# Patient Record
Sex: Female | Born: 1965 | Race: Black or African American | Hispanic: No | Marital: Single | Smoking: Never smoker
Health system: Southern US, Community
[De-identification: ages and names within clinical notes are randomized; demographics above are authoritative.]

## PROBLEM LIST (undated history)

## (undated) DIAGNOSIS — F32A Depression, unspecified: Secondary | ICD-10-CM

## (undated) DIAGNOSIS — F250 Schizoaffective disorder, bipolar type: Secondary | ICD-10-CM

## (undated) DIAGNOSIS — F419 Anxiety disorder, unspecified: Secondary | ICD-10-CM

## (undated) DIAGNOSIS — K5900 Constipation, unspecified: Secondary | ICD-10-CM

## (undated) DIAGNOSIS — I4510 Unspecified right bundle-branch block: Secondary | ICD-10-CM

## (undated) DIAGNOSIS — I10 Essential (primary) hypertension: Secondary | ICD-10-CM

## (undated) DIAGNOSIS — F329 Major depressive disorder, single episode, unspecified: Secondary | ICD-10-CM

## (undated) DIAGNOSIS — F79 Unspecified intellectual disabilities: Secondary | ICD-10-CM

## (undated) DIAGNOSIS — M1990 Unspecified osteoarthritis, unspecified site: Secondary | ICD-10-CM

## (undated) DIAGNOSIS — R42 Dizziness and giddiness: Secondary | ICD-10-CM

## (undated) DIAGNOSIS — E119 Type 2 diabetes mellitus without complications: Secondary | ICD-10-CM

## (undated) DIAGNOSIS — F259 Schizoaffective disorder, unspecified: Secondary | ICD-10-CM

## (undated) DIAGNOSIS — E785 Hyperlipidemia, unspecified: Secondary | ICD-10-CM

## (undated) HISTORY — PX: ABDOMINAL HYSTERECTOMY: SHX81

## (undated) HISTORY — PX: BACK SURGERY: SHX140

---

## 2003-11-10 ENCOUNTER — Ambulatory Visit: Payer: Self-pay | Admitting: Internal Medicine

## 2004-06-28 ENCOUNTER — Emergency Department: Payer: Self-pay | Admitting: Unknown Physician Specialty

## 2006-06-03 ENCOUNTER — Emergency Department: Payer: Self-pay | Admitting: Emergency Medicine

## 2006-06-04 ENCOUNTER — Inpatient Hospital Stay: Payer: Self-pay | Admitting: Internal Medicine

## 2006-06-13 ENCOUNTER — Inpatient Hospital Stay: Payer: Self-pay | Admitting: Internal Medicine

## 2006-07-12 ENCOUNTER — Emergency Department: Payer: Self-pay | Admitting: Emergency Medicine

## 2010-05-02 ENCOUNTER — Ambulatory Visit: Payer: Self-pay | Admitting: Internal Medicine

## 2010-06-21 ENCOUNTER — Ambulatory Visit: Payer: Self-pay | Admitting: Emergency Medicine

## 2010-06-22 ENCOUNTER — Ambulatory Visit: Payer: Self-pay | Admitting: Emergency Medicine

## 2010-06-26 LAB — PATHOLOGY REPORT

## 2010-06-29 ENCOUNTER — Emergency Department: Payer: Self-pay | Admitting: Emergency Medicine

## 2011-05-29 ENCOUNTER — Ambulatory Visit: Payer: Self-pay | Admitting: Internal Medicine

## 2012-06-23 ENCOUNTER — Ambulatory Visit: Payer: Self-pay | Admitting: Internal Medicine

## 2013-04-04 ENCOUNTER — Emergency Department: Payer: Self-pay | Admitting: Emergency Medicine

## 2013-04-04 LAB — CBC WITH DIFFERENTIAL/PLATELET
Basophil #: 0 10*3/uL (ref 0.0–0.1)
Basophil %: 0.5 %
EOS PCT: 3.2 %
Eosinophil #: 0.2 10*3/uL (ref 0.0–0.7)
HCT: 34.9 % — ABNORMAL LOW (ref 35.0–47.0)
HGB: 12 g/dL (ref 12.0–16.0)
LYMPHS ABS: 1.7 10*3/uL (ref 1.0–3.6)
Lymphocyte %: 33.4 %
MCH: 31.2 pg (ref 26.0–34.0)
MCHC: 34.3 g/dL (ref 32.0–36.0)
MCV: 91 fL (ref 80–100)
MONOS PCT: 10.7 %
Monocyte #: 0.6 x10 3/mm (ref 0.2–0.9)
NEUTROS ABS: 2.7 10*3/uL (ref 1.4–6.5)
Neutrophil %: 52.2 %
Platelet: 134 10*3/uL — ABNORMAL LOW (ref 150–440)
RBC: 3.84 10*6/uL (ref 3.80–5.20)
RDW: 14.4 % (ref 11.5–14.5)
WBC: 5.1 10*3/uL (ref 3.6–11.0)

## 2013-04-04 LAB — COMPREHENSIVE METABOLIC PANEL
ALBUMIN: 3.6 g/dL (ref 3.4–5.0)
ANION GAP: 3 — AB (ref 7–16)
Alkaline Phosphatase: 146 U/L — ABNORMAL HIGH
BUN: 15 mg/dL (ref 7–18)
Bilirubin,Total: 0.2 mg/dL (ref 0.2–1.0)
Calcium, Total: 8.8 mg/dL (ref 8.5–10.1)
Chloride: 105 mmol/L (ref 98–107)
Co2: 31 mmol/L (ref 21–32)
Creatinine: 1.56 mg/dL — ABNORMAL HIGH (ref 0.60–1.30)
EGFR (African American): 45 — ABNORMAL LOW
GFR CALC NON AF AMER: 39 — AB
Glucose: 166 mg/dL — ABNORMAL HIGH (ref 65–99)
Osmolality: 282 (ref 275–301)
POTASSIUM: 4.2 mmol/L (ref 3.5–5.1)
SGOT(AST): 28 U/L (ref 15–37)
SGPT (ALT): 45 U/L (ref 12–78)
SODIUM: 139 mmol/L (ref 136–145)
TOTAL PROTEIN: 7.5 g/dL (ref 6.4–8.2)

## 2013-04-04 LAB — URINALYSIS, COMPLETE
Bilirubin,UR: NEGATIVE
Blood: NEGATIVE
Glucose,UR: NEGATIVE mg/dL (ref 0–75)
KETONE: NEGATIVE
Nitrite: NEGATIVE
PROTEIN: NEGATIVE
Ph: 6 (ref 4.5–8.0)
RBC,UR: 2 /HPF (ref 0–5)
Specific Gravity: 1.01 (ref 1.003–1.030)
WBC UR: 15 /HPF (ref 0–5)

## 2013-04-30 ENCOUNTER — Ambulatory Visit: Payer: Self-pay | Admitting: Gastroenterology

## 2013-05-11 ENCOUNTER — Ambulatory Visit: Payer: Self-pay | Admitting: Gastroenterology

## 2013-05-18 ENCOUNTER — Ambulatory Visit: Payer: Self-pay | Admitting: Gastroenterology

## 2013-05-19 ENCOUNTER — Emergency Department: Payer: Self-pay | Admitting: Emergency Medicine

## 2013-05-19 LAB — URINALYSIS, COMPLETE
BLOOD: NEGATIVE
Bilirubin,UR: NEGATIVE
Glucose,UR: NEGATIVE mg/dL (ref 0–75)
Ketone: NEGATIVE
Leukocyte Esterase: NEGATIVE
Nitrite: NEGATIVE
Ph: 5 (ref 4.5–8.0)
Protein: NEGATIVE
Specific Gravity: 1.005 (ref 1.003–1.030)

## 2013-05-19 LAB — CBC
HCT: 36.3 % (ref 35.0–47.0)
HGB: 12.3 g/dL (ref 12.0–16.0)
MCH: 30.5 pg (ref 26.0–34.0)
MCHC: 33.8 g/dL (ref 32.0–36.0)
MCV: 90 fL (ref 80–100)
PLATELETS: 129 10*3/uL — AB (ref 150–440)
RBC: 4.02 10*6/uL (ref 3.80–5.20)
RDW: 13.6 % (ref 11.5–14.5)
WBC: 6.1 10*3/uL (ref 3.6–11.0)

## 2013-05-19 LAB — COMPREHENSIVE METABOLIC PANEL
ALBUMIN: 3.5 g/dL (ref 3.4–5.0)
ALK PHOS: 125 U/L — AB
AST: 32 U/L (ref 15–37)
Anion Gap: 4 — ABNORMAL LOW (ref 7–16)
BUN: 9 mg/dL (ref 7–18)
Bilirubin,Total: 0.4 mg/dL (ref 0.2–1.0)
CO2: 28 mmol/L (ref 21–32)
Calcium, Total: 9.2 mg/dL (ref 8.5–10.1)
Chloride: 108 mmol/L — ABNORMAL HIGH (ref 98–107)
Creatinine: 0.99 mg/dL (ref 0.60–1.30)
EGFR (Non-African Amer.): >60
GLUCOSE: 121 mg/dL — AB (ref 65–99)
OSMOLALITY: 279 (ref 275–301)
Potassium: 4.1 mmol/L (ref 3.5–5.1)
SGPT (ALT): 28 U/L (ref 12–78)
SODIUM: 140 mmol/L (ref 136–145)
Total Protein: 7.2 g/dL (ref 6.4–8.2)

## 2013-05-19 LAB — LIPASE, BLOOD: Lipase: 78 U/L (ref 73–393)

## 2013-07-14 ENCOUNTER — Ambulatory Visit: Payer: Self-pay | Admitting: Internal Medicine

## 2013-07-21 ENCOUNTER — Ambulatory Visit: Payer: Self-pay | Admitting: Internal Medicine

## 2014-04-11 ENCOUNTER — Emergency Department: Payer: Self-pay | Admitting: Emergency Medicine

## 2014-04-27 ENCOUNTER — Emergency Department
Admit: 2014-04-27 | Disposition: A | Discharge status: Home / Self Care | Payer: Self-pay | Admitting: Emergency Medicine

## 2014-04-27 LAB — CBC WITH DIFFERENTIAL/PLATELET
BASOS ABS: 0 10*3/uL (ref 0.0–0.1)
BASOS PCT: 0.6 %
Eosinophil #: 0.2 10*3/uL (ref 0.0–0.7)
Eosinophil %: 3.5 %
HCT: 35 % (ref 35.0–47.0)
HGB: 11.8 g/dL — ABNORMAL LOW (ref 12.0–16.0)
LYMPHS ABS: 1.2 10*3/uL (ref 1.0–3.6)
Lymphocyte %: 23 %
MCH: 30.4 pg (ref 26.0–34.0)
MCHC: 33.6 g/dL (ref 32.0–36.0)
MCV: 91 fL (ref 80–100)
MONO ABS: 0.4 x10 3/mm (ref 0.2–0.9)
Monocyte %: 8 %
NEUTROS ABS: 3.3 10*3/uL (ref 1.4–6.5)
NEUTROS PCT: 64.9 %
PLATELETS: 113 10*3/uL — AB (ref 150–440)
RBC: 3.87 10*6/uL (ref 3.80–5.20)
RDW: 13.3 % (ref 11.5–14.5)
WBC: 5.1 10*3/uL (ref 3.6–11.0)

## 2014-04-27 LAB — COMPREHENSIVE METABOLIC PANEL
ANION GAP: 6 — AB (ref 7–16)
AST: 20 U/L
Albumin: 3.9 g/dL
Alkaline Phosphatase: 104 U/L
BILIRUBIN TOTAL: 0.2 mg/dL — AB
BUN: 12 mg/dL
CALCIUM: 9.8 mg/dL
CHLORIDE: 104 mmol/L
Co2: 29 mmol/L
Creatinine: 1.03 mg/dL — ABNORMAL HIGH
EGFR (African American): >60
Glucose: 192 mg/dL — ABNORMAL HIGH
POTASSIUM: 3.8 mmol/L
SGPT (ALT): 22 U/L
SODIUM: 139 mmol/L
Total Protein: 7 g/dL

## 2014-04-27 LAB — URINALYSIS, COMPLETE
Bilirubin,UR: NEGATIVE
Blood: NEGATIVE
Glucose,UR: >=500 mg/dL (ref 0–75)
Ketone: NEGATIVE
NITRITE: NEGATIVE
PH: 5 (ref 4.5–8.0)
PROTEIN: NEGATIVE
Specific Gravity: 1.008 (ref 1.003–1.030)
Squamous Epithelial: 2

## 2014-05-16 ENCOUNTER — Emergency Department
Admit: 2014-05-16 | Disposition: A | Discharge status: Home / Self Care | Payer: Self-pay | Admitting: Emergency Medicine

## 2014-05-16 LAB — URINALYSIS, COMPLETE
BLOOD: NEGATIVE
Bilirubin,UR: NEGATIVE
Ketone: NEGATIVE
NITRITE: NEGATIVE
PH: 6 (ref 4.5–8.0)
Protein: NEGATIVE
Specific Gravity: 1.006 (ref 1.003–1.030)

## 2014-05-18 LAB — URINE CULTURE

## 2014-06-05 ENCOUNTER — Emergency Department
Admission: EM | Admit: 2014-06-05 | Discharge: 2014-06-05 | Disposition: A | Discharge status: Home / Self Care | Payer: Medicare Other | Attending: Emergency Medicine | Admitting: Emergency Medicine

## 2014-06-05 ENCOUNTER — Encounter: Payer: Self-pay | Admitting: Emergency Medicine

## 2014-06-05 ENCOUNTER — Emergency Department: Payer: Medicare Other

## 2014-06-05 DIAGNOSIS — I10 Essential (primary) hypertension: Secondary | ICD-10-CM | POA: Diagnosis not present

## 2014-06-05 DIAGNOSIS — G8929 Other chronic pain: Secondary | ICD-10-CM | POA: Diagnosis not present

## 2014-06-05 DIAGNOSIS — M545 Low back pain: Secondary | ICD-10-CM | POA: Insufficient documentation

## 2014-06-05 DIAGNOSIS — E119 Type 2 diabetes mellitus without complications: Secondary | ICD-10-CM | POA: Insufficient documentation

## 2014-06-05 DIAGNOSIS — M5410 Radiculopathy, site unspecified: Secondary | ICD-10-CM | POA: Insufficient documentation

## 2014-06-05 HISTORY — DX: Constipation, unspecified: K59.00

## 2014-06-05 HISTORY — DX: Type 2 diabetes mellitus without complications: E11.9

## 2014-06-05 HISTORY — DX: Hyperlipidemia, unspecified: E78.5

## 2014-06-05 HISTORY — DX: Unspecified intellectual disabilities: F79

## 2014-06-05 HISTORY — DX: Schizoaffective disorder, bipolar type: F25.0

## 2014-06-05 HISTORY — DX: Essential (primary) hypertension: I10

## 2014-06-05 HISTORY — DX: Schizoaffective disorder, unspecified: F25.9

## 2014-06-05 LAB — URINALYSIS COMPLETE WITH MICROSCOPIC (ARMC ONLY)
Bilirubin Urine: NEGATIVE
Glucose, UA: 50 mg/dL — AB
Hgb urine dipstick: NEGATIVE
Ketones, ur: NEGATIVE mg/dL
Nitrite: NEGATIVE
Protein, ur: NEGATIVE mg/dL
Specific Gravity, Urine: 1.008 (ref 1.005–1.030)
pH: 5 (ref 5.0–8.0)

## 2014-06-05 MED ORDER — ETODOLAC 400 MG PO TABS: 400.0000 mg | ORAL_TABLET | Freq: Two times a day (BID) | ORAL | Status: DC

## 2014-06-05 NOTE — ED Notes (Signed)
BIB from [facility] with group home owner Reece LeaderBarbara [place]. Pt reports lower back pain that runs down right leg for past 2 weeks. Denies injury

## 2014-06-05 NOTE — ED Provider Notes (Signed)
[facility] Emergency Department Provider Note  ____________________________________________  Time seen: 11:25 AM  I have reviewed the triage vital signs and the nursing notes.   HISTORY  Chief Complaint Back Pain   HPI Kristina Foster is a 49 y.o. female putting today by her caregiver at a group home with complaint of constant low back pain that runs into her right leg for the past 2 weeks. She has been seen by Dr. Maryellen PileEason her primary care doctor who placed her on tramadol. 2 weeks ago she was placed on Mobic which did not help her pain at all The group home owner states that this is not helping with the pain at all.Most of the history is given by Kristina LeaderBarbara Foster, but the patient occasionally is verbal. Low back pain mostly on the right with some radiation into the right leg. Patient states it's a 10 out of 10 on the pain scale. Movement makes her pain worse.  Her given her caregiver states she wants tests done today for back pain.   Past Medical History  Diagnosis Date  . Diabetes mellitus without complication   . Mental disability   . Hypertension   . Hyperlipemia   . Schizo affective schizophrenia   . Constipation     There are no active problems to display for this patient.   Past Surgical History  Procedure Laterality Date  . Abdominal hysterectomy      Current Outpatient Rx  Name  Route  Sig  Dispense  Refill  . etodolac (LODINE) 400 MG tablet   Oral   Take 1 tablet (400 mg total) by mouth 2 (two) times daily.   20 tablet   0     Allergies Review of patient's allergies indicates no known allergies.  History reviewed. No pertinent family history.  Social History History  Substance Use Topics  . Smoking status: Never Smoker   . Smokeless tobacco: Not on file  . Alcohol Use: No    Review of Systems Constitutional: No fever/chills Eyes: No visual changes. ENT: No sore throat. Cardiovascular: Denies chest pain. Respiratory:  Denies shortness of breath. Gastrointestinal: No abdominal pain.  No nausea, no vomiting.  No diarrhea.  Positive chronic constipation. Genitourinary: Negative for dysuria. Musculoskeletal: Positive for back pain. Skin: Negative for rash. Neurological: Negative for headaches, focal weakness or numbness.  10-point ROS otherwise negative.  ____________________________________________   PHYSICAL EXAM:  VITAL SIGNS: ED Triage Vitals  Enc Vitals Group     BP 06/05/14 1048 100/59 mmHg     Pulse Rate 06/05/14 1048 72     Resp 06/05/14 1048 18     Temp 06/05/14 1048 98.2 F (36.8 C)     Temp Source 06/05/14 1048 Oral     SpO2 06/05/14 1048 100 %     Weight 06/05/14 1048 178 lb (80.74 kg)     Height 06/05/14 1048 5\' 2"  (1.575 m)     Head Cir --      Peak Flow --      Pain Score 06/05/14 1059 10     Pain Loc --      Pain Edu? --      Excl. in GC? --     Constitutional: Alert and oriented. Well appearing and in no acute distress. Eyes: Conjunctivae are normal. PERRL. EOMI. Head: Atraumatic. Nose: No congestion/rhinnorhea. Neck: No stridor.  Nontender cervical spine to palpation Hematological/Lymphatic/Immunilogical: No cervical lymphadenopathy. Cardiovascular: Normal rate, regular rhythm. Grossly normal heart sounds.  Good  peripheral circulation. Respiratory: Normal respiratory effort.  No retractions. Lungs CTAB. Gastrointestinal: Soft and nontender. No distention. No abdominal bruits. No CVA tenderness. Musculoskeletal: No lower extremity tenderness nor edema.  No joint effusions. Neurologic:  Normal speech and language. No gross focal neurologic deficits are appreciated. Speech is normal. No gait instability. Skin:  Skin is warm, dry and intact. No rash noted. Psychiatric: Mood and affect are flat. Speech simple 1 words and behavior are normal.  ____________________________________________   LABS (all labs ordered are listed, but only abnormal results are displayed)  Labs  Reviewed  URINALYSIS COMPLETEWITH MICROSCOPIC ([facility])  - Abnormal; Notable for the following:    Color, Urine YELLOW (*)    APPearance CLEAR (*)    Glucose, UA 50 (*)    Leukocytes, UA 2+ (*)    Bacteria, UA RARE (*)    Squamous Epithelial / LPF 0-5 (*)    All other components within normal limits   ____________________________________________  EKG Not done ____________________________________________  RADIOLOGY  Mild multilevel degenerative disc disease and facet arthropathy ____________________________________________   PROCEDURES  Procedure(s) performed: None  Critical Care performed: No  ____________________________________________   INITIAL IMPRESSION / ASSESSMENT AND PLAN / ED COURSE  Pertinent labs & imaging results that were available during my care of the patient were reviewed by me and considered in my medical decision making (see chart for details).  Caregiver states that the patient has an appointment [facility] next week in the orthopedic department. She will start a prescription of Lodine and continue tramadol as needed for severe pain. ____________________________________________   FINAL CLINICAL IMPRESSION(S) / ED DIAGNOSES  Final diagnoses:  Chronic lower back pain  Radicular pain of right lower extremity      Tommi RumpsRhonda L Aislee Landgren, PA-C 06/05/14 1402  Phineas SemenGraydon Goodman, MD 06/05/14 1510

## 2014-06-05 NOTE — ED Notes (Signed)
NAD noted at time of D/C. Pt denies questions or concerns. Pt taken to the lobby at this time via wheelchair.   

## 2014-06-28 ENCOUNTER — Other Ambulatory Visit: Payer: Self-pay | Admitting: Specialist

## 2014-06-28 DIAGNOSIS — M5136 Other intervertebral disc degeneration, lumbar region: Secondary | ICD-10-CM

## 2014-07-04 ENCOUNTER — Ambulatory Visit
Admission: RE | Admit: 2014-07-04 | Discharge: 2014-07-04 | Disposition: A | Discharge status: Home / Self Care | Payer: Medicare Other | Source: Ambulatory Visit | Attending: Specialist | Admitting: Specialist

## 2014-07-04 DIAGNOSIS — M4806 Spinal stenosis, lumbar region: Secondary | ICD-10-CM | POA: Diagnosis not present

## 2014-07-04 DIAGNOSIS — M5136 Other intervertebral disc degeneration, lumbar region: Secondary | ICD-10-CM | POA: Insufficient documentation

## 2014-07-04 DIAGNOSIS — Q7649 Other congenital malformations of spine, not associated with scoliosis: Secondary | ICD-10-CM | POA: Diagnosis not present

## 2014-07-04 DIAGNOSIS — N289 Disorder of kidney and ureter, unspecified: Secondary | ICD-10-CM | POA: Insufficient documentation

## 2014-07-12 ENCOUNTER — Ambulatory Visit: Admission: RE | Admit: 2014-07-12 | Payer: Medicare Other | Source: Ambulatory Visit | Admitting: Gastroenterology

## 2014-07-12 ENCOUNTER — Encounter: Admission: RE | Payer: Self-pay | Source: Ambulatory Visit

## 2014-07-12 SURGERY — COLONOSCOPY WITH PROPOFOL
Anesthesia: General

## 2014-07-12 SURGERY — COLONOSCOPY
Anesthesia: General

## 2014-07-18 ENCOUNTER — Other Ambulatory Visit: Payer: Self-pay | Admitting: Neurosurgery

## 2014-07-28 NOTE — Pre-Procedure Instructions (Signed)
   Willaim Baneatricia A [place]omac  07/28/2014     Your procedure is scheduled on: Tuesday August 02, 2014 at 11:30 AM.  [facility] Ad[facility] at 9:30 A.M.  Call this number if you have problems the morning of surgery: [phone]   Remember:  Do not eat food or drink liquids after midnight.  Take these medicines the morning of surgery with A SIP OF WATER: Acetaminophen (Tylenol) if needed, Benztropine (Cogentin), Metoprolol (Toprol XL), Tramadol (Ultram) if needed, Venlafaxine (Effexor)   Only take 16 Units of Lantus the night before your surgery   Do NOT take any diabetic medications the morning of your surgery (Ex. Glipizide/Glucotrol)   Please stop taking any aspirin, vitamins, herbal medications, Ibuprofen, Advil, Motrin, Aleve, etc   Do not wear jewelry, make-up or nail polish.  Do not wear lotions, powders, or perfumes.   Do not shave 48 hours prior to surgery.    Do not bring valuables to the hospital.  [facility] is not responsible for any belongings or valuables.  Contacts, dentures or bridgework may not be worn into surgery.  Leave your suitcase in the car.  After surgery it may be brought to your room.  For patients admitted to the hospital, discharge time will be determined by your treatment team.  Patients discharged the day of surgery will not be allowed to drive home.   Name and phone number of your driver:    Special instructions:  Shower using CHG soap the night before and the morning of your surgery  Please read over the following fact sheets that you were given. Pain Booklet, Coughing and Deep Breathing, Blood Transfusion Information, MRSA Information and Surgical Site Infection Prevention

## 2014-07-29 ENCOUNTER — Encounter (HOSPITAL_COMMUNITY)
Admission: RE | Admit: 2014-07-29 | Discharge: 2014-07-29 | Disposition: A | Discharge status: Home / Self Care | Payer: Medicare Other | Source: Ambulatory Visit | Attending: Neurosurgery | Admitting: Neurosurgery

## 2014-07-29 ENCOUNTER — Encounter (HOSPITAL_COMMUNITY): Payer: Self-pay

## 2014-07-29 DIAGNOSIS — Z794 Long term (current) use of insulin: Secondary | ICD-10-CM | POA: Diagnosis not present

## 2014-07-29 DIAGNOSIS — F259 Schizoaffective disorder, unspecified: Secondary | ICD-10-CM | POA: Diagnosis not present

## 2014-07-29 DIAGNOSIS — I4510 Unspecified right bundle-branch block: Secondary | ICD-10-CM | POA: Diagnosis not present

## 2014-07-29 DIAGNOSIS — Z01818 Encounter for other preprocedural examination: Secondary | ICD-10-CM | POA: Diagnosis present

## 2014-07-29 DIAGNOSIS — Z7982 Long term (current) use of aspirin: Secondary | ICD-10-CM | POA: Diagnosis not present

## 2014-07-29 DIAGNOSIS — Z0183 Encounter for blood typing: Secondary | ICD-10-CM | POA: Diagnosis not present

## 2014-07-29 DIAGNOSIS — Z01812 Encounter for preprocedural laboratory examination: Secondary | ICD-10-CM | POA: Insufficient documentation

## 2014-07-29 DIAGNOSIS — Z79899 Other long term (current) drug therapy: Secondary | ICD-10-CM | POA: Diagnosis not present

## 2014-07-29 DIAGNOSIS — E119 Type 2 diabetes mellitus without complications: Secondary | ICD-10-CM | POA: Insufficient documentation

## 2014-07-29 DIAGNOSIS — M4806 Spinal stenosis, lumbar region: Secondary | ICD-10-CM | POA: Insufficient documentation

## 2014-07-29 DIAGNOSIS — M479 Spondylosis, unspecified: Secondary | ICD-10-CM | POA: Diagnosis not present

## 2014-07-29 HISTORY — DX: Depression, unspecified: F32.A

## 2014-07-29 HISTORY — DX: Dizziness and giddiness: R42

## 2014-07-29 HISTORY — DX: Major depressive disorder, single episode, unspecified: F32.9

## 2014-07-29 HISTORY — DX: Unspecified osteoarthritis, unspecified site: M19.90

## 2014-07-29 HISTORY — DX: Anxiety disorder, unspecified: F41.9

## 2014-07-29 LAB — BASIC METABOLIC PANEL
Anion gap: 7 (ref 5–15)
BUN: 14 mg/dL (ref 6–20)
CHLORIDE: 101 mmol/L (ref 101–111)
CO2: 32 mmol/L (ref 22–32)
Calcium: 10.2 mg/dL (ref 8.9–10.3)
Creatinine, Ser: 1.08 mg/dL — ABNORMAL HIGH (ref 0.44–1.00)
GFR calc Af Amer: >60 mL/min (ref >60)
GFR, EST NON AFRICAN AMERICAN: 59 mL/min — AB (ref >60)
GLUCOSE: 176 mg/dL — AB (ref 65–99)
POTASSIUM: 4 mmol/L (ref 3.5–5.1)
SODIUM: 140 mmol/L (ref 135–145)

## 2014-07-29 LAB — SURGICAL PCR SCREEN
MRSA, PCR: NEGATIVE
Staphylococcus aureus: NEGATIVE

## 2014-07-29 LAB — CBC
HEMATOCRIT: 35.1 % — AB (ref 36.0–46.0)
Hemoglobin: 12.2 g/dL (ref 12.0–15.0)
MCH: 30.7 pg (ref 26.0–34.0)
MCHC: 34.8 g/dL (ref 30.0–36.0)
MCV: 88.2 fL (ref 78.0–100.0)
Platelets: 121 10*3/uL — ABNORMAL LOW (ref 150–400)
RBC: 3.98 MIL/uL (ref 3.87–5.11)
RDW: 12.9 % (ref 11.5–15.5)
WBC: 5.7 10*3/uL (ref 4.0–10.5)

## 2014-07-29 LAB — GLUCOSE, CAPILLARY: Glucose-Capillary: 203 mg/dL — ABNORMAL HIGH (ref 65–99)

## 2014-07-29 NOTE — Progress Notes (Signed)
Patient arrived to PAT via wheelchair with caregiver Britta MccreedyBarbara. [facility] been living with her for 35 years. Patient is alert and oriented X 4. PCP is Toy CookeyErnest Eason. Patient denied having any acute cardiac or pulmonary issues.   Nurse asked patients caregiver about glucose and she informed Nurse that the highest patients blood sugar has been was 352 and the lowest was 54. CBG on arrival to PAT was 203.

## 2014-07-29 NOTE — Progress Notes (Signed)
Blood bank called and told secretary Meriam SpragueBeverly that another T&S would need to be re-drawn DOS due to antibodies being found in blood. Will order STAT T&S DOS.

## 2014-08-01 MED ORDER — CEFAZOLIN SODIUM-DEXTROSE 2-3 GM-% IV SOLR
2.0000 g | INTRAVENOUS | Status: AC
  Administered 2014-08-02: 2 g via INTRAVENOUS

## 2014-08-01 NOTE — Progress Notes (Signed)
Anesthesia Chart Review: Patient is a 49 year old female scheduled for L4-5 decompression with PLIF on 08/02/14 by Dr. Conchita ParisNundkumar.  History includes non-smoker, Schizoaffective schizophrenia, HLD, anxiety, DM2, arthritis, hysterectomy, dizziness, chronic constipation. PCP was reported as Dr. Toy CookeyErnest Eason.  Meds include ASA (on hold), Lipitor, Cogentin, Welchol, glipizide, Lantus, Norco, lisinopril-HCTZ, Amitiza, Toprol XL, Risperidal, Effexor.  07/29/14 EKG: NSR, RBBB. RBBB has been present since at least 06/21/10.  Preoperative labs noted. She needs T&S redrawn due to antibodies. According to PAT RN notes, per patient's caregiver, highest recent glucose 352 and lowest 54. A1C was not done at PAT, so is scheduled to be drawn on the morning of surgery. Non-fasting glucose at PAT was 176. She will get a fasting CBG on arrival the day of surgery.  If CBG results acceptable on the day of surgery and otherwise no acute changes then I would anticipate that she could proceed as planned.  Velna Ochsllison Zelenak, PA-C [facility]/Anesthesiology Phone [phone] 08/01/2014 10:03 AM

## 2014-08-02 ENCOUNTER — Encounter (HOSPITAL_COMMUNITY): Payer: Self-pay | Admitting: Anesthesiology

## 2014-08-02 ENCOUNTER — Inpatient Hospital Stay (HOSPITAL_COMMUNITY): Payer: Medicare Other

## 2014-08-02 ENCOUNTER — Inpatient Hospital Stay (HOSPITAL_COMMUNITY): Payer: Medicare Other | Admitting: Anesthesiology

## 2014-08-02 ENCOUNTER — Inpatient Hospital Stay (HOSPITAL_COMMUNITY): Payer: Medicare Other | Admitting: Vascular Surgery

## 2014-08-02 ENCOUNTER — Encounter (HOSPITAL_COMMUNITY)
Admission: RE | Disposition: A | Discharge status: Skilled Nursing Facility | Payer: Self-pay | Source: Ambulatory Visit | Attending: Neurosurgery

## 2014-08-02 ENCOUNTER — Inpatient Hospital Stay (HOSPITAL_COMMUNITY)
Admission: RE | Admit: 2014-08-02 | Discharge: 2014-08-05 | DRG: 460 | Disposition: A | Discharge status: Skilled Nursing Facility | Payer: Medicare Other | Source: Ambulatory Visit | Attending: Neurosurgery | Admitting: Neurosurgery

## 2014-08-02 DIAGNOSIS — M4806 Spinal stenosis, lumbar region: Secondary | ICD-10-CM | POA: Diagnosis not present

## 2014-08-02 DIAGNOSIS — Z23 Encounter for immunization: Secondary | ICD-10-CM | POA: Diagnosis not present

## 2014-08-02 DIAGNOSIS — E785 Hyperlipidemia, unspecified: Secondary | ICD-10-CM | POA: Diagnosis not present

## 2014-08-02 DIAGNOSIS — R109 Unspecified abdominal pain: Secondary | ICD-10-CM | POA: Diagnosis not present

## 2014-08-02 DIAGNOSIS — I959 Hypotension, unspecified: Secondary | ICD-10-CM | POA: Diagnosis not present

## 2014-08-02 DIAGNOSIS — E119 Type 2 diabetes mellitus without complications: Secondary | ICD-10-CM | POA: Diagnosis not present

## 2014-08-02 DIAGNOSIS — M47896 Other spondylosis, lumbar region: Secondary | ICD-10-CM | POA: Diagnosis present

## 2014-08-02 DIAGNOSIS — M79604 Pain in right leg: Secondary | ICD-10-CM | POA: Diagnosis present

## 2014-08-02 DIAGNOSIS — F259 Schizoaffective disorder, unspecified: Secondary | ICD-10-CM | POA: Diagnosis not present

## 2014-08-02 DIAGNOSIS — K5900 Constipation, unspecified: Secondary | ICD-10-CM | POA: Diagnosis not present

## 2014-08-02 DIAGNOSIS — M1990 Unspecified osteoarthritis, unspecified site: Secondary | ICD-10-CM | POA: Diagnosis not present

## 2014-08-02 DIAGNOSIS — I10 Essential (primary) hypertension: Secondary | ICD-10-CM | POA: Diagnosis not present

## 2014-08-02 DIAGNOSIS — M5416 Radiculopathy, lumbar region: Secondary | ICD-10-CM | POA: Diagnosis present

## 2014-08-02 DIAGNOSIS — M47816 Spondylosis without myelopathy or radiculopathy, lumbar region: Secondary | ICD-10-CM | POA: Diagnosis present

## 2014-08-02 DIAGNOSIS — M549 Dorsalgia, unspecified: Secondary | ICD-10-CM | POA: Diagnosis present

## 2014-08-02 DIAGNOSIS — Z419 Encounter for procedure for purposes other than remedying health state, unspecified: Secondary | ICD-10-CM

## 2014-08-02 LAB — GLUCOSE, CAPILLARY
GLUCOSE-CAPILLARY: 260 mg/dL — AB (ref 65–99)
Glucose-Capillary: 103 mg/dL — ABNORMAL HIGH (ref 65–99)
Glucose-Capillary: 104 mg/dL — ABNORMAL HIGH (ref 65–99)

## 2014-08-02 LAB — CBC
HEMATOCRIT: 30.4 % — AB (ref 36.0–46.0)
Hemoglobin: 11 g/dL — ABNORMAL LOW (ref 12.0–15.0)
MCH: 31.8 pg (ref 26.0–34.0)
MCHC: 36.2 g/dL — ABNORMAL HIGH (ref 30.0–36.0)
MCV: 87.9 fL (ref 78.0–100.0)
Platelets: 112 10*3/uL — ABNORMAL LOW (ref 150–400)
RBC: 3.46 MIL/uL — ABNORMAL LOW (ref 3.87–5.11)
RDW: 13 % (ref 11.5–15.5)
WBC: 8.4 10*3/uL (ref 4.0–10.5)

## 2014-08-02 LAB — CREATININE, SERUM
CREATININE: 1.12 mg/dL — AB (ref 0.44–1.00)
GFR calc Af Amer: >60 mL/min (ref >60)
GFR calc non Af Amer: 57 mL/min — ABNORMAL LOW (ref >60)

## 2014-08-02 LAB — TYPE AND SCREEN
ABO/RH(D): AB POS
Antibody Screen: POSITIVE

## 2014-08-02 SURGERY — POSTERIOR LUMBAR FUSION 1 LEVEL
Anesthesia: General

## 2014-08-02 MED ORDER — BUPIVACAINE LIPOSOME 1.3 % IJ SUSP
20.0000 mL | INTRAMUSCULAR | Status: DC
  Filled 2014-08-02: qty 20

## 2014-08-02 MED ORDER — CEFAZOLIN SODIUM 1-5 GM-% IV SOLN
1.0000 g | Freq: Three times a day (TID) | INTRAVENOUS | Status: AC
  Administered 2014-08-02 – 2014-08-03 (×2): 1 g via INTRAVENOUS
  Filled 2014-08-02 (×4): qty 50

## 2014-08-02 MED ORDER — PHENYLEPHRINE HCL 10 MG/ML IJ SOLN
10.0000 mg | INTRAVENOUS | Status: DC | PRN
  Administered 2014-08-02: 20 ug/min via INTRAVENOUS

## 2014-08-02 MED ORDER — BENZTROPINE MESYLATE 0.5 MG PO TABS
2.0000 mg | ORAL_TABLET | Freq: Two times a day (BID) | ORAL | Status: DC
  Administered 2014-08-02 – 2014-08-05 (×6): 2 mg via ORAL
  Filled 2014-08-02 (×6): qty 4

## 2014-08-02 MED ORDER — THROMBIN 20000 UNITS EX SOLR
CUTANEOUS | Status: DC | PRN
  Administered 2014-08-02: 12:00:00 via TOPICAL

## 2014-08-02 MED ORDER — GLIPIZIDE 5 MG PO TABS
10.0000 mg | ORAL_TABLET | Freq: Every day | ORAL | Status: DC
  Administered 2014-08-03 – 2014-08-05 (×3): 10 mg via ORAL
  Filled 2014-08-02 (×3): qty 2

## 2014-08-02 MED ORDER — MORPHINE SULFATE 2 MG/ML IJ SOLN
1.0000 mg | INTRAMUSCULAR | Status: DC | PRN
  Filled 2014-08-02: qty 1

## 2014-08-02 MED ORDER — ONDANSETRON HCL 4 MG/2ML IJ SOLN
INTRAMUSCULAR | Status: DC | PRN
  Administered 2014-08-02: 4 mg via INTRAVENOUS

## 2014-08-02 MED ORDER — PHENYLEPHRINE HCL 10 MG/ML IJ SOLN
INTRAMUSCULAR | Status: DC | PRN
  Administered 2014-08-02: 80 ug via INTRAVENOUS
  Administered 2014-08-02: 40 ug via INTRAVENOUS
  Administered 2014-08-02 (×2): 80 ug via INTRAVENOUS
  Administered 2014-08-02: 40 ug via INTRAVENOUS

## 2014-08-02 MED ORDER — VECURONIUM BROMIDE 10 MG IV SOLR
INTRAVENOUS | Status: DC | PRN
  Administered 2014-08-02: 10 mg via INTRAVENOUS

## 2014-08-02 MED ORDER — FENTANYL CITRATE (PF) 250 MCG/5ML IJ SOLN
INTRAMUSCULAR | Status: AC
  Filled 2014-08-02: qty 5

## 2014-08-02 MED ORDER — MAGNESIUM HYDROXIDE 400 MG/5ML PO SUSP: 5.0000 mL | Freq: Every day | ORAL | Status: DC | PRN

## 2014-08-02 MED ORDER — BUPIVACAINE HCL (PF) 0.5 % IJ SOLN
INTRAMUSCULAR | Status: DC | PRN
  Administered 2014-08-02: 10 mL

## 2014-08-02 MED ORDER — ACETAMINOPHEN 10 MG/ML IV SOLN
INTRAVENOUS | Status: AC
  Administered 2014-08-02: 1000 mg via INTRAVENOUS
  Filled 2014-08-02: qty 100

## 2014-08-02 MED ORDER — ONDANSETRON HCL 4 MG/2ML IJ SOLN: 4.0000 mg | Freq: Once | INTRAMUSCULAR | Status: DC | PRN

## 2014-08-02 MED ORDER — PHENOL 1.4 % MT LIQD: 1.0000 | OROMUCOSAL | Status: DC | PRN

## 2014-08-02 MED ORDER — BACITRACIN 50000 UNITS IM SOLR
INTRAMUSCULAR | Status: DC | PRN
  Administered 2014-08-02 (×2)

## 2014-08-02 MED ORDER — ACETAMINOPHEN 325 MG PO TABS
650.0000 mg | ORAL_TABLET | ORAL | Status: DC | PRN
  Administered 2014-08-05: 650 mg via ORAL

## 2014-08-02 MED ORDER — INSULIN GLARGINE 100 UNIT/ML ~~LOC~~ SOLN
20.0000 [IU] | Freq: Every day | SUBCUTANEOUS | Status: DC
  Administered 2014-08-02 – 2014-08-04 (×3): 20 [IU] via SUBCUTANEOUS
  Filled 2014-08-02 (×4): qty 0.2

## 2014-08-02 MED ORDER — 0.9 % SODIUM CHLORIDE (POUR BTL) OPTIME
TOPICAL | Status: DC | PRN
  Administered 2014-08-02: 1000 mL

## 2014-08-02 MED ORDER — ATORVASTATIN CALCIUM 10 MG PO TABS
10.0000 mg | ORAL_TABLET | Freq: Every day | ORAL | Status: DC
  Administered 2014-08-02 – 2014-08-05 (×4): 10 mg via ORAL
  Filled 2014-08-02 (×4): qty 1

## 2014-08-02 MED ORDER — MENTHOL 3 MG MT LOZG: 1.0000 | LOZENGE | OROMUCOSAL | Status: DC | PRN

## 2014-08-02 MED ORDER — SODIUM CHLORIDE 0.9 % IJ SOLN: 3.0000 mL | INTRAMUSCULAR | Status: DC | PRN

## 2014-08-02 MED ORDER — BUPIVACAINE LIPOSOME 1.3 % IJ SUSP
INTRAMUSCULAR | Status: DC | PRN
  Administered 2014-08-02: 20 mL

## 2014-08-02 MED ORDER — STERILE WATER FOR INJECTION IJ SOLN
INTRAMUSCULAR | Status: AC
  Filled 2014-08-02: qty 10

## 2014-08-02 MED ORDER — HEPARIN SODIUM (PORCINE) 5000 UNIT/ML IJ SOLN
5000.0000 [IU] | Freq: Three times a day (TID) | INTRAMUSCULAR | Status: DC
  Administered 2014-08-03 – 2014-08-05 (×8): 5000 [IU] via SUBCUTANEOUS
  Filled 2014-08-02 (×8): qty 1

## 2014-08-02 MED ORDER — OXYCODONE-ACETAMINOPHEN 5-325 MG PO TABS
1.0000 | ORAL_TABLET | ORAL | Status: DC | PRN
  Administered 2014-08-02 – 2014-08-03 (×2): 2 via ORAL
  Administered 2014-08-03: 1 via ORAL
  Administered 2014-08-04 (×2): 2 via ORAL
  Administered 2014-08-05: 1 via ORAL
  Filled 2014-08-02: qty 2
  Filled 2014-08-02: qty 1
  Filled 2014-08-02 (×4): qty 2

## 2014-08-02 MED ORDER — LISINOPRIL 10 MG PO TABS
10.0000 mg | ORAL_TABLET | Freq: Every day | ORAL | Status: DC
  Administered 2014-08-02 – 2014-08-05 (×3): 10 mg via ORAL
  Filled 2014-08-02 (×4): qty 1

## 2014-08-02 MED ORDER — PROPOFOL 10 MG/ML IV BOLUS
INTRAVENOUS | Status: DC | PRN
  Administered 2014-08-02: 150 mg via INTRAVENOUS

## 2014-08-02 MED ORDER — ACETAMINOPHEN 650 MG RE SUPP: 650.0000 mg | RECTAL | Status: DC | PRN

## 2014-08-02 MED ORDER — HYDROMORPHONE HCL 1 MG/ML IJ SOLN: 0.5000 mg | INTRAMUSCULAR | Status: DC | PRN

## 2014-08-02 MED ORDER — SODIUM CHLORIDE 0.9 % IV SOLN
INTRAVENOUS | Status: DC
  Administered 2014-08-02 – 2014-08-04 (×3): via INTRAVENOUS

## 2014-08-02 MED ORDER — MIDAZOLAM HCL 2 MG/2ML IJ SOLN
INTRAMUSCULAR | Status: AC
  Filled 2014-08-02: qty 2

## 2014-08-02 MED ORDER — LIDOCAINE HCL (CARDIAC) 20 MG/ML IV SOLN
INTRAVENOUS | Status: DC | PRN
  Administered 2014-08-02: 100 mg via INTRAVENOUS
  Administered 2014-08-02: 100 mg via INTRATRACHEAL

## 2014-08-02 MED ORDER — PNEUMOCOCCAL VAC POLYVALENT 25 MCG/0.5ML IJ INJ
0.5000 mL | INJECTION | INTRAMUSCULAR | Status: AC
  Administered 2014-08-03: 0.5 mL via INTRAMUSCULAR
  Filled 2014-08-02: qty 0.5

## 2014-08-02 MED ORDER — SUCCINYLCHOLINE CHLORIDE 20 MG/ML IJ SOLN
INTRAMUSCULAR | Status: DC | PRN
  Administered 2014-08-02: 120 mg via INTRAVENOUS

## 2014-08-02 MED ORDER — HYDROCHLOROTHIAZIDE 12.5 MG PO CAPS
12.5000 mg | ORAL_CAPSULE | Freq: Every day | ORAL | Status: DC
  Administered 2014-08-02 – 2014-08-05 (×3): 12.5 mg via ORAL
  Filled 2014-08-02 (×4): qty 1

## 2014-08-02 MED ORDER — VECURONIUM BROMIDE 10 MG IV SOLR
INTRAVENOUS | Status: AC
  Filled 2014-08-02: qty 10

## 2014-08-02 MED ORDER — CEFAZOLIN SODIUM-DEXTROSE 2-3 GM-% IV SOLR
INTRAVENOUS | Status: AC
  Filled 2014-08-02: qty 50

## 2014-08-02 MED ORDER — SODIUM CHLORIDE 0.9 % IJ SOLN
3.0000 mL | Freq: Two times a day (BID) | INTRAMUSCULAR | Status: DC
  Administered 2014-08-03: 3 mL via INTRAVENOUS

## 2014-08-02 MED ORDER — METOPROLOL SUCCINATE ER 100 MG PO TB24
100.0000 mg | ORAL_TABLET | Freq: Every day | ORAL | Status: DC
  Administered 2014-08-04 – 2014-08-05 (×2): 100 mg via ORAL
  Filled 2014-08-02 (×3): qty 1

## 2014-08-02 MED ORDER — MIDAZOLAM HCL 5 MG/5ML IJ SOLN
INTRAMUSCULAR | Status: DC | PRN
  Administered 2014-08-02: 2 mg via INTRAVENOUS

## 2014-08-02 MED ORDER — SODIUM CHLORIDE 0.9 % IV SOLN
250.0000 mL | INTRAVENOUS | Status: DC
  Administered 2014-08-02: 250 mL via INTRAVENOUS

## 2014-08-02 MED ORDER — NEOSTIGMINE METHYLSULFATE 10 MG/10ML IV SOLN
INTRAVENOUS | Status: DC | PRN
  Administered 2014-08-02: 3 mg via INTRAVENOUS

## 2014-08-02 MED ORDER — VENLAFAXINE HCL 37.5 MG PO TABS
75.0000 mg | ORAL_TABLET | Freq: Two times a day (BID) | ORAL | Status: DC
  Administered 2014-08-02 – 2014-08-05 (×6): 75 mg via ORAL
  Filled 2014-08-02 (×6): qty 2

## 2014-08-02 MED ORDER — FENTANYL CITRATE (PF) 100 MCG/2ML IJ SOLN
INTRAMUSCULAR | Status: DC | PRN
  Administered 2014-08-02: 100 ug via INTRAVENOUS
  Administered 2014-08-02 (×3): 50 ug via INTRAVENOUS

## 2014-08-02 MED ORDER — LISINOPRIL-HYDROCHLOROTHIAZIDE 10-12.5 MG PO TABS: 1.0000 | ORAL_TABLET | Freq: Every day | ORAL | Status: DC

## 2014-08-02 MED ORDER — LIDOCAINE-EPINEPHRINE 1 %-1:100000 IJ SOLN
INTRAMUSCULAR | Status: DC | PRN
  Administered 2014-08-02: 10 mL

## 2014-08-02 MED ORDER — LACTATED RINGERS IV SOLN
INTRAVENOUS | Status: DC | PRN
  Administered 2014-08-02 (×2): via INTRAVENOUS

## 2014-08-02 MED ORDER — GLYCOPYRROLATE 0.2 MG/ML IJ SOLN
INTRAMUSCULAR | Status: DC | PRN
  Administered 2014-08-02: 0.6 mg via INTRAVENOUS

## 2014-08-02 MED ORDER — RISPERIDONE 0.5 MG PO TABS
1.0000 mg | ORAL_TABLET | Freq: Every day | ORAL | Status: DC
  Administered 2014-08-02 – 2014-08-04 (×3): 1 mg via ORAL
  Filled 2014-08-02 (×3): qty 2

## 2014-08-02 MED ORDER — PROPOFOL 10 MG/ML IV BOLUS
INTRAVENOUS | Status: AC
  Filled 2014-08-02: qty 20

## 2014-08-02 MED ORDER — ONDANSETRON HCL 4 MG/2ML IJ SOLN: 4.0000 mg | INTRAMUSCULAR | Status: DC | PRN

## 2014-08-02 MED ORDER — DIAZEPAM 5 MG PO TABS: 5.0000 mg | ORAL_TABLET | Freq: Four times a day (QID) | ORAL | Status: DC | PRN

## 2014-08-02 MED ORDER — POLYETHYLENE GLYCOL 3350 17 G PO PACK
17.0000 g | PACK | ORAL | Status: DC
  Filled 2014-08-02: qty 1

## 2014-08-02 MED ORDER — LIDOCAINE HCL (CARDIAC) 20 MG/ML IV SOLN
INTRAVENOUS | Status: AC
  Filled 2014-08-02: qty 5

## 2014-08-02 MED ORDER — THROMBIN 5000 UNITS EX SOLR
CUTANEOUS | Status: DC | PRN
  Administered 2014-08-02: 12:00:00 via TOPICAL

## 2014-08-02 MED ORDER — ROCURONIUM BROMIDE 50 MG/5ML IV SOLN
INTRAVENOUS | Status: AC
  Filled 2014-08-02: qty 1

## 2014-08-02 MED ORDER — INSULIN ASPART 100 UNIT/ML ~~LOC~~ SOLN
0.0000 [IU] | Freq: Three times a day (TID) | SUBCUTANEOUS | Status: DC
  Administered 2014-08-03: 11 [IU] via SUBCUTANEOUS
  Administered 2014-08-03: 2 [IU] via SUBCUTANEOUS
  Administered 2014-08-03 – 2014-08-04 (×2): 3 [IU] via SUBCUTANEOUS
  Administered 2014-08-04 (×2): 5 [IU] via SUBCUTANEOUS
  Administered 2014-08-05: 3 [IU] via SUBCUTANEOUS
  Administered 2014-08-05: 5 [IU] via SUBCUTANEOUS
  Administered 2014-08-05: 3 [IU] via SUBCUTANEOUS

## 2014-08-02 MED ORDER — LUBIPROSTONE 24 MCG PO CAPS
24.0000 ug | ORAL_CAPSULE | Freq: Two times a day (BID) | ORAL | Status: DC
  Administered 2014-08-02 – 2014-08-05 (×7): 24 ug via ORAL
  Filled 2014-08-02 (×7): qty 1

## 2014-08-02 SURGICAL SUPPLY — 76 items
BAG DECANTER FOR FLEXI CONT (MISCELLANEOUS) ×3 IMPLANT
BENZOIN TINCTURE PRP APPL 2/3 (GAUZE/BANDAGES/DRESSINGS) IMPLANT
BIT DRILL 2.9 STRL (BIT) ×2 IMPLANT
BIT DRILL 2.9MM STRL (BIT) ×1
BLADE CLIPPER SURG (BLADE) IMPLANT
BLADE SURG 11 STRL SS (BLADE) ×3 IMPLANT
BUR MATCHSTICK NEURO 3.0 LAGG (BURR) ×3 IMPLANT
BUR PRECISION FLUTE 5.0 (BURR) ×6 IMPLANT
CAGE PEEK 9X22 (Cage) ×6 IMPLANT
CANISTER SUCT 3000ML PPV (MISCELLANEOUS) ×3 IMPLANT
CATH FOLEY 2WAY SLVR  5CC 14FR (CATHETERS) ×2
CATH FOLEY 2WAY SLVR 5CC 14FR (CATHETERS) ×1 IMPLANT
CLOSURE WOUND 1/2 X4 (GAUZE/BANDAGES/DRESSINGS)
CONT SPEC 4OZ CLIKSEAL STRL BL (MISCELLANEOUS) ×3 IMPLANT
COVER BACK TABLE 60X90IN (DRAPES) ×3 IMPLANT
DECANTER SPIKE VIAL GLASS SM (MISCELLANEOUS) ×3 IMPLANT
DERMABOND ADVANCED (GAUZE/BANDAGES/DRESSINGS) ×2
DERMABOND ADVANCED .7 DNX12 (GAUZE/BANDAGES/DRESSINGS) ×1 IMPLANT
DRAPE C-ARM 42X72 X-RAY (DRAPES) ×3 IMPLANT
DRAPE C-ARMOR (DRAPES) ×3 IMPLANT
DRAPE LAPAROTOMY 100X72X124 (DRAPES) ×3 IMPLANT
DRAPE MICROSCOPE LEICA (MISCELLANEOUS) ×3 IMPLANT
DRAPE POUCH INSTRU U-SHP 10X18 (DRAPES) ×3 IMPLANT
DRAPE SURG 17X23 STRL (DRAPES) ×3 IMPLANT
DRSG OPSITE POSTOP 4X6 (GAUZE/BANDAGES/DRESSINGS) ×3 IMPLANT
DURAPREP 26ML APPLICATOR (WOUND CARE) ×3 IMPLANT
ELECT REM PT RETURN 9FT ADLT (ELECTROSURGICAL) ×3
ELECTRODE REM PT RTRN 9FT ADLT (ELECTROSURGICAL) ×1 IMPLANT
GAUZE SPONGE 4X4 12PLY STRL (GAUZE/BANDAGES/DRESSINGS) IMPLANT
GAUZE SPONGE 4X4 16PLY XRAY LF (GAUZE/BANDAGES/DRESSINGS) IMPLANT
GLOVE BIOGEL PI IND STRL 7.5 (GLOVE) ×1 IMPLANT
GLOVE BIOGEL PI INDICATOR 7.5 (GLOVE) ×2
GLOVE ECLIPSE 7.0 STRL STRAW (GLOVE) ×3 IMPLANT
GLOVE ECLIPSE 8.5 STRL (GLOVE) ×3 IMPLANT
GLOVE EXAM NITRILE LRG STRL (GLOVE) IMPLANT
GLOVE EXAM NITRILE MD LF STRL (GLOVE) IMPLANT
GLOVE EXAM NITRILE XL STR (GLOVE) IMPLANT
GLOVE EXAM NITRILE XS STR PU (GLOVE) IMPLANT
GLOVE INDICATOR 7.5 STRL GRN (GLOVE) ×6 IMPLANT
GLOVE INDICATOR 8.0 STRL GRN (GLOVE) ×3 IMPLANT
GLOVE INDICATOR 8.5 STRL (GLOVE) ×3 IMPLANT
GOWN STRL REUS W/ TWL LRG LVL3 (GOWN DISPOSABLE) ×3 IMPLANT
GOWN STRL REUS W/ TWL XL LVL3 (GOWN DISPOSABLE) IMPLANT
GOWN STRL REUS W/TWL 2XL LVL3 (GOWN DISPOSABLE) ×6 IMPLANT
GOWN STRL REUS W/TWL LRG LVL3 (GOWN DISPOSABLE) ×6
GOWN STRL REUS W/TWL XL LVL3 (GOWN DISPOSABLE)
HEMOSTAT POWDER KIT SURGIFOAM (HEMOSTASIS) ×3 IMPLANT
KIT BASIN OR (CUSTOM PROCEDURE TRAY) ×3 IMPLANT
KIT ROOM TURNOVER OR (KITS) ×3 IMPLANT
MILL MEDIUM DISP (BLADE) ×6 IMPLANT
NEEDLE HYPO 18GX1.5 BLUNT FILL (NEEDLE) IMPLANT
NEEDLE HYPO 25X1 1.5 SAFETY (NEEDLE) ×3 IMPLANT
NEEDLE SPNL 18GX3.5 QUINCKE PK (NEEDLE) ×3 IMPLANT
NS IRRIG 1000ML POUR BTL (IV SOLUTION) ×3 IMPLANT
PACK LAMINECTOMY NEURO (CUSTOM PROCEDURE TRAY) ×3 IMPLANT
PAD ARMBOARD 7.5X6 YLW CONV (MISCELLANEOUS) ×9 IMPLANT
ROD COBALT 47.5X35 (Rod) ×6 IMPLANT
RUBBERBAND STERILE (MISCELLANEOUS) IMPLANT
SCREW MAS 5.5X30 (Screw) ×6 IMPLANT
SCREW MAS 6.5X35 (Screw) ×6 IMPLANT
SCREW SET SOLERA (Screw) ×8 IMPLANT
SCREW SET SOLERA TI (Screw) ×4 IMPLANT
SPONGE LAP 4X18 X RAY DECT (DISPOSABLE) IMPLANT
SPONGE SURGIFOAM ABS GEL 100 (HEMOSTASIS) ×3 IMPLANT
STRIP BIOACTIVE VITOSS 25X52X4 (Orthopedic Implant) ×3 IMPLANT
STRIP CLOSURE SKIN 1/2X4 (GAUZE/BANDAGES/DRESSINGS) IMPLANT
SUT VIC AB 0 CT1 18XCR BRD8 (SUTURE) ×1 IMPLANT
SUT VIC AB 0 CT1 8-18 (SUTURE) ×2
SUT VIC AB 2-0 CT1 18 (SUTURE) IMPLANT
SUT VICRYL 3-0 RB1 18 ABS (SUTURE) ×6 IMPLANT
SYR 20ML ECCENTRIC (SYRINGE) ×3 IMPLANT
SYR 3ML LL SCALE MARK (SYRINGE) IMPLANT
TOWEL OR 17X24 6PK STRL BLUE (TOWEL DISPOSABLE) ×3 IMPLANT
TOWEL OR 17X26 10 PK STRL BLUE (TOWEL DISPOSABLE) ×3 IMPLANT
TRAP SPECIMEN MUCOUS 40CC (MISCELLANEOUS) ×3 IMPLANT
WATER STERILE IRR 1000ML POUR (IV SOLUTION) ×3 IMPLANT

## 2014-08-02 NOTE — Transfer of Care (Signed)
Immediate Anesthesia Transfer of Care Note  Patient: Kristina Foster  Procedure(s) Performed: Procedure(s): LUMBAR FOUR-FIVE POSTERIOR LUMBAR INTERBODY FUSION WITH POSTERIOR LATERAL ARTHRODESIS AND POSTERIOR NONSEGMENTAL INSTRUMENTATION (N/A)  Patient Location: PACU  Anesthesia Type:General  Level of Consciousness: awake, lethargic and responds to stimulation  Airway & Oxygen Therapy: Patient Spontanous Breathing and Patient connected to nasal cannula oxygen  Post-op Assessment: Report given to RN, Post -op Vital signs reviewed and stable and Patient moving all extremities X 4  Post vital signs: Reviewed and stable  Last Vitals:  Filed Vitals:   08/02/14 1533  BP: 104/62  Pulse: 66  Temp: 36.8 C  Resp: 17    Complications: No apparent anesthesia complications

## 2014-08-02 NOTE — Op Note (Signed)
PREOP DIAGNOSIS:  1. Lumbago with radiculopathy 2. Spinal stenosis, L4-5 3. Lumbar spondylosis, L4-5  POSTOP DIAGNOSIS: Same  PROCEDURE: 1. L4 laminectomy with radical facetectomy for decompression of exiting nerve roots, more than would be required for placement of interbody graft 2. Placement of anterior interbody device - Medtronic Peek Capstone Cage, 9mm x2 3. Posterior instrumentation using cortical pedicle screws at L4, L5 4. Interbody arthrodesis, L4-5 5. Use of locally harvested bone autograft 6. Use of non-structural bone allograft (Vitoss)  SURGEON: Dr. Lisbeth Renshaw, MD  ASSISTANT: Dr. Barnett Abu, MD  ANESTHESIA: General Endotracheal  EBL: 400cc  SPECIMENS: None  DRAINS: None  COMPLICATIONS: None immediate  CONDITION: Hemodynamically stable to PACU  HISTORY: Kristina Foster is a 49 y.o. female initially seen in the outpatient clinic with back and right greater than left leg pain which had been progressively worsening over the last several months. Treatment options were discussed, including continued conservative management versus surgical decompression and fusion. After all her questions were answered, she elected to proceed with surgery.  PROCEDURE IN DETAIL: After informed consent was obtained and witnessed, the patient was brought to the operating room. After induction of general anesthesia, the patient was positioned on the operative table in the prone position. All pressure points were meticulously padded. Skin incision was then marked out and prepped and draped in the usual sterile fashion.  After timeout was conducted, skin was infiltrated with local anesthetic. Skin incision was then made sharply and Bovie electrocautery was used to dissect the subcutaneous tissue until the lumbodorsal fascia was identified and incised. The muscle was then elevated in the subperiosteal plane. Self-retaining retractors were then placed after exposure of the L4 lamina out  to the pars interarticularis as was the L3-4 facet complex and L4-5 facet complexes were identified.  Using intraoperative fluoroscopy, entry points for bilateral L4 cortical pedicle screws were identified. Pilot holes were then created under lateral fluoroscopy and tapped to 5.26mm x 30 mm.  At this point attention was turned to decompression. Complete L4 laminectomy with facetectomy was completed using a combination of Kerrison rongeurs and a high-speed drill. There did appear to be hypertrophy of the right L4-5 facet complex, with redundant ligamentum flavum appeared to cause stenosis of the right L4-5 foramen.  Once decompression was completed, the L4-5 disc space was identified, incised, and using a combination of curettes, rongeurs, and shavers, a complete discectomy was completed. The endplates were then prepared with curettes. A 9 mm graft was then trialed. The disc space was then packed with a combination of the locally harvested, morcellized bone autograft, as well as Vitoss which was prepared with the patient's own blood. The 9 mm cages were then tapped into place under fluoroscopy.   At this point, the L5 pilot holes were drilled and tapped to 6.5 mm x 35 mm. Pedicle screws were then placed at L4 and L5. During placement of the right L5 pedicle screw, the superior articulating process appeared to fracture, but no stenosis was identified. The screws were then connected with a 35mm rod, set screws were placed, and final tightened. Final AP and lateral fluoroscopic images were taken to confirm good placement of the hardware.  The wound is then closed in standard fashion using a combination of interrupted 0 and 3-0 Vicryl stitches and the muscular, fascial, and subcutaneous layers. Skin was then closed using standard Dermabond. Sterile dressing was then applied. The patient was then transferred to the stretcher, extubated, and taken to the postanesthesia care  unit in stable hemodynamic  condition.  At the end of the case all sponge, needle, cottonoid, and instrument counts were correct.

## 2014-08-02 NOTE — Progress Notes (Signed)
Patient arrived to [id number]. Patient is drowsy, arouses to voice, oriented x3, able to follow commands. Honeycomb dressing is clean, dry, and intact. No drain present. Foley intact, unclamped. Denies pain, PAINAD 0, respirations at 14. Patient and caretaker oriented to staff, unit. Patient vocalized understanding of fall prevention policy. Call bell within patient's reach, patient pointed to call bell button to press for assistance. Will continue to monitor.

## 2014-08-02 NOTE — Anesthesia Postprocedure Evaluation (Signed)
  Anesthesia Post-op Note  Patient: Willaim Baneatricia A Mccamish  Procedure(s) Performed: Procedure(s): LUMBAR FOUR-FIVE POSTERIOR LUMBAR INTERBODY FUSION WITH POSTERIOR LATERAL ARTHRODESIS AND POSTERIOR NONSEGMENTAL INSTRUMENTATION (N/A)  Patient Location: PACU  Anesthesia Type:General  Level of Consciousness: awake, oriented, sedated and patient cooperative  Airway and Oxygen Therapy: Patient Spontanous Breathing  Post-op Pain: mild  Post-op Assessment: Post-op Vital signs reviewed, Patient's Cardiovascular Status Stable, Respiratory Function Stable, Patent Airway, No signs of Nausea or vomiting and Pain level controlled              Post-op Vital Signs: stable  Last Vitals:  Filed Vitals:   08/02/14 1604  BP: 117/78  Pulse: 67  Temp:   Resp: 8    Complications: No apparent anesthesia complications

## 2014-08-02 NOTE — Progress Notes (Signed)
Transported per Leotis ShamesLauren , NT

## 2014-08-02 NOTE — H&P (Signed)
CC:  Back and right leg pain  HPI: Kristina Foster is a 49 y.o. female initially seen in the outpatient neurosurgery clinic with a history of low back pain and right greater than left leg pain. She describes the pain running down into her right hip, and down her right leg including the lateral aspect of her ankle. She says it hurts to walk, and she is having trouble even making it to the bathroom from her bedroom at home. She does have a history of cognitive impairment and lives with a caregiver for the last 30 years. She has been taking oxycodone and tramadol, and has tried a back brace but none of these things helped, and her pain continues to progress.  PMH: Past Medical History  Diagnosis Date  . Mental disability   . Hypertension   . Hyperlipemia   . Schizo affective schizophrenia   . Constipation   . Depression   . Anxiety   . Diabetes mellitus without complication     Type 2   . Arthritis   . Dizziness     PSH: Past Surgical History  Procedure Laterality Date  . Abdominal hysterectomy      SH: History  Substance Use Topics  . Smoking status: Never Smoker   . Smokeless tobacco: Not on file  . Alcohol Use: No    MEDS: Prior to Admission medications   Medication Sig Start Date End Date Taking? Authorizing Provider  aspirin 81 MG tablet Take 81 mg by mouth daily.   Yes Historical Provider, MD  atorvastatin (LIPITOR) 10 MG tablet Take 10 mg by mouth daily.   Yes Historical Provider, MD  benztropine (COGENTIN) 2 MG tablet Take 2 mg by mouth 2 (two) times daily.   Yes Historical Provider, MD  colesevelam (WELCHOL) 625 MG tablet Take by mouth 2 (two) times daily with a meal.   Yes Historical Provider, MD  glipiZIDE (GLUCOTROL) 10 MG tablet Take 10 mg by mouth daily before breakfast.   Yes Historical Provider, MD  HYDROcodone-acetaminophen (NORCO/VICODIN) 5-325 MG per tablet Take 1 tablet by mouth every 6 (six) hours as needed for moderate pain.   Yes Historical Provider,  MD  insulin glargine (LANTUS) 100 UNIT/ML injection Inject 20 Units into the skin at bedtime.    Yes Historical Provider, MD  lisinopril-hydrochlorothiazide (PRINZIDE,ZESTORETIC) 10-12.5 MG per tablet Take 1 tablet by mouth daily.   Yes Historical Provider, MD  lubiprostone (AMITIZA) 24 MCG capsule Take 24 mcg by mouth 2 (two) times daily with a meal.   Yes Historical Provider, MD  metoprolol succinate (TOPROL-XL) 100 MG 24 hr tablet Take 100 mg by mouth daily.   Yes Historical Provider, MD  polyethylene glycol (MIRALAX / GLYCOLAX) packet Take 17 g by mouth 2 (two) times a week.    Yes Historical Provider, MD  risperiDONE (RISPERDAL) 1 MG tablet Take 1 mg by mouth at bedtime.   Yes Historical Provider, MD  venlafaxine (EFFEXOR) 75 MG tablet Take 75 mg by mouth 2 (two) times daily.   Yes Historical Provider, MD  magnesium hydroxide (MILK OF MAGNESIA) 400 MG/5ML suspension Take by mouth daily as needed for mild constipation.    Historical Provider, MD    ALLERGY: No Known Allergies  ROS: ROS  NEUROLOGIC EXAM: Awake, alert, oriented Memory and concentration grossly intact Speech fluent, appropriate CN grossly intact Motor exam: Upper Extremities Deltoid Bicep Tricep Grip  Right 5/5 5/5 5/5 5/5  Left 5/5 5/5 5/5 5/5   Lower Extremity  IP Quad PF DF EHL  Right 5/5 5/5 5/5 5/5 5/5  Left 5/5 5/5 5/5 5/5 5/5   Sensation grossly intact to LT  IMPRESSION: - 49 y.o. female with progressive back and right leg pain related to multifactorial central and foraminal stenosis, right greater than left.  PLAN: - Proceed with L4-5 decompression fusion  I have reviewed the treatment options with the patient and her caregiver in the office, including continued conservative treatments, versus surgical decompression and fusion. The risks and benefits of each option were discussed in detail. After all questions were answered, they elected to proceed with surgery. All questions were answered.

## 2014-08-02 NOTE — Anesthesia Procedure Notes (Addendum)
Procedure Name: Intubation Date/Time: 08/02/2014 11:33 AM Performed by: Lovie CholOCK, Iviona Hole K Pre-anesthesia Checklist: Patient identified, Emergency Drugs available, Suction available, Patient being monitored and Timeout performed Patient Re-evaluated:Patient Re-evaluated prior to inductionOxygen Delivery Method: Circle system utilized Preoxygenation: Pre-oxygenation with 100% oxygen Intubation Type: IV induction Ventilation: Mask ventilation without difficulty and Oral airway inserted - appropriate to patient size Laryngoscope Size: Miller and 2 Tube type: Oral Tube size: 7.0 mm Number of attempts: 1 Airway Equipment and Method: Stylet Placement Confirmation: ETT inserted through vocal cords under direct vision,  positive ETCO2,  CO2 detector and breath sounds checked- equal and bilateral Secured at: 21 cm Tube secured with: Tape Dental Injury: Teeth and Oropharynx as per pre-operative assessment

## 2014-08-02 NOTE — Anesthesia Preprocedure Evaluation (Addendum)
Anesthesia Evaluation  Patient identified by MRN, date of birth, ID band Patient awake    Reviewed: Allergy & Precautions, NPO status , Patient's Chart, lab work & pertinent test results  History of Anesthesia Complications Negative for: history of anesthetic complications  Airway Mallampati: II  TM Distance: >3 FB Neck ROM: Full    Dental  (+) Poor Dentition, Dental Advisory Given   Pulmonary neg pulmonary ROS,  breath sounds clear to auscultation  Pulmonary exam normal       Cardiovascular hypertension, Pt. on medications and Pt. on home beta blockers Normal cardiovascular examRhythm:Regular Rate:Normal     Neuro/Psych PSYCHIATRIC DISORDERS Anxiety Depression Schizophrenia negative neurological ROS     GI/Hepatic negative GI ROS, Neg liver ROS,   Endo/Other  diabetes, Type 2, Oral Hypoglycemic Agents, Insulin Dependent  Renal/GU negative Renal ROS     Musculoskeletal  (+) Arthritis -,   Abdominal   Peds  Hematology   Anesthesia Other Findings   Reproductive/Obstetrics negative OB ROS                            Anesthesia Physical Anesthesia Plan  ASA: III  Anesthesia Plan: General   Post-op Pain Management:    Induction: Intravenous  Airway Management Planned: Oral ETT  Additional Equipment:   Intra-op Plan:   Post-operative Plan: Extubation in OR  Informed Consent: I have reviewed the patients History and Physical, chart, labs and discussed the procedure including the risks, benefits and alternatives for the proposed anesthesia with the patient or authorized representative who has indicated his/her understanding and acceptance.     Plan Discussed with: CRNA, Anesthesiologist and Surgeon  Anesthesia Plan Comments:         Anesthesia Quick Evaluation

## 2014-08-02 NOTE — Plan of Care (Signed)
Problem: Consults Goal: Diagnosis - Spinal Surgery Outcome: Completed/Met Date Met:  08/02/14 Thoraco/Lumbar Spine Fusion

## 2014-08-03 DIAGNOSIS — M4806 Spinal stenosis, lumbar region: Secondary | ICD-10-CM | POA: Diagnosis not present

## 2014-08-03 LAB — COMPREHENSIVE METABOLIC PANEL
ALK PHOS: 102 U/L (ref 38–126)
ALT: 43 U/L (ref 14–54)
ANION GAP: 6 (ref 5–15)
AST: 29 U/L (ref 15–41)
Albumin: 3.4 g/dL — ABNORMAL LOW (ref 3.5–5.0)
BUN: 11 mg/dL (ref 6–20)
CHLORIDE: 100 mmol/L — AB (ref 101–111)
CO2: 31 mmol/L (ref 22–32)
Calcium: 9.2 mg/dL (ref 8.9–10.3)
Creatinine, Ser: 1.18 mg/dL — ABNORMAL HIGH (ref 0.44–1.00)
GFR calc Af Amer: >60 mL/min (ref >60)
GFR calc non Af Amer: 53 mL/min — ABNORMAL LOW (ref >60)
Glucose, Bld: 147 mg/dL — ABNORMAL HIGH (ref 65–99)
Potassium: 4 mmol/L (ref 3.5–5.1)
Sodium: 137 mmol/L (ref 135–145)
Total Bilirubin: 0.3 mg/dL (ref 0.3–1.2)
Total Protein: 6.3 g/dL — ABNORMAL LOW (ref 6.5–8.1)

## 2014-08-03 LAB — TYPE AND SCREEN
ABO/RH(D): AB POS
ANTIBODY SCREEN: POSITIVE
DAT, IgG: NEGATIVE
PT AG Type: NEGATIVE
UNIT DIVISION: 0
Unit division: 0

## 2014-08-03 LAB — GLUCOSE, CAPILLARY
GLUCOSE-CAPILLARY: 326 mg/dL — AB (ref 65–99)
Glucose-Capillary: 130 mg/dL — ABNORMAL HIGH (ref 65–99)
Glucose-Capillary: 171 mg/dL — ABNORMAL HIGH (ref 65–99)
Glucose-Capillary: 257 mg/dL — ABNORMAL HIGH (ref 65–99)

## 2014-08-03 LAB — CBC
HEMATOCRIT: 29 % — AB (ref 36.0–46.0)
Hemoglobin: 10 g/dL — ABNORMAL LOW (ref 12.0–15.0)
MCH: 30.8 pg (ref 26.0–34.0)
MCHC: 34.5 g/dL (ref 30.0–36.0)
MCV: 89.2 fL (ref 78.0–100.0)
PLATELETS: 100 10*3/uL — AB (ref 150–400)
RBC: 3.25 MIL/uL — AB (ref 3.87–5.11)
RDW: 13 % (ref 11.5–15.5)
WBC: 6.7 10*3/uL (ref 4.0–10.5)

## 2014-08-03 LAB — HEMOGLOBIN A1C
Hgb A1c MFr Bld: 6.9 % — ABNORMAL HIGH (ref 4.8–5.6)
Mean Plasma Glucose: 151 mg/dL

## 2014-08-03 MED ORDER — SODIUM CHLORIDE 0.9 % IV BOLUS (SEPSIS)
1000.0000 mL | Freq: Once | INTRAVENOUS | Status: AC
  Administered 2014-08-03: 1000 mL via INTRAVENOUS

## 2014-08-03 MED ORDER — POLYETHYLENE GLYCOL 3350 17 G PO PACK
17.0000 g | PACK | Freq: Every day | ORAL | Status: DC
  Administered 2014-08-03 – 2014-08-05 (×3): 17 g via ORAL
  Filled 2014-08-03 (×2): qty 1

## 2014-08-03 MED ORDER — INSULIN ASPART 100 UNIT/ML ~~LOC~~ SOLN
3.0000 [IU] | Freq: Three times a day (TID) | SUBCUTANEOUS | Status: DC
  Administered 2014-08-03 – 2014-08-05 (×7): 3 [IU] via SUBCUTANEOUS

## 2014-08-03 MED ORDER — BISACODYL 10 MG RE SUPP
10.0000 mg | Freq: Every day | RECTAL | Status: DC | PRN
  Administered 2014-08-03 – 2014-08-05 (×3): 10 mg via RECTAL
  Filled 2014-08-03 (×4): qty 1

## 2014-08-03 NOTE — Evaluation (Signed)
Physical Therapy Evaluation Patient Details Name: Kristina Foster MRN: [id number] DOB: 12-Jul-1965 Today's Date: 08/03/2014   History of Present Illness  49 y.o. female s/p L4-5 posterior lumbar interbody fusion. PMH: cognitive impairment and lives with caregiver  Clinical Impression  Patient demonstrates deficits in functional mobility as indicated below. Will need continued skilled PT to address deficits and maximize function. Will see as indicated and progress as tolerated.  Will need ST SNF upon acute discharge.     Follow Up Recommendations SNF    Equipment Recommendations  Rolling walker with 5" wheels    Recommendations for Other Services       Precautions / Restrictions Precautions Precautions: Back Precaution Booklet Issued: Yes (comment) Precaution Comments: handout left in room for caregiver; 3 main precautions reviewed with Pt Required Braces or Orthoses: Spinal Brace Spinal Brace: Lumbar corset;Applied in sitting position Restrictions Weight Bearing Restrictions: No      Mobility  Bed Mobility Overal bed mobility: Needs Assistance Bed Mobility: Rolling;Sidelying to Sit Rolling: Mod assist Sidelying to sit: Mod assist       General bed mobility comments: recevied in chair   Transfers Overall transfer level: Needs assistance Equipment used: Rolling walker (2 wheeled) Transfers: Sit to/from Stand Sit to Stand: Min assist         General transfer comment: min assist to come to standing, 2 trials to complete successfully, increased time and VCs for hand placement and safety  Ambulation/Gait Ambulation/Gait assistance: Min assist Ambulation Distance (Feet): 60 Feet Assistive device: Rolling walker (2 wheeled) Gait Pattern/deviations: Step-through pattern;Decreased stride length;Narrow base of support Gait velocity: decreased Gait velocity interpretation: Below normal speed for age/gender General Gait Details: patient required cues for upright posture,  assist for postioning and stability, assist to steer RW at times.   Stairs            Wheelchair Mobility    Modified Rankin (Stroke Patients Only)       Balance Overall balance assessment: Needs assistance Sitting-balance support: Feet supported       Standing balance support: Bilateral upper extremity supported;During functional activity                                 Pertinent Vitals/Pain Pain Assessment: Faces Faces Pain Scale: Hurts even more Pain Location: back and buttocks Pain Descriptors / Indicators: Aching;Grimacing Pain Intervention(s): Monitored during session    Home Living Family/patient expects to be discharged to:: Unsure Living Arrangements: Group Home Available Help at Discharge: Other (Comment) (Unknown; caregiver not present) Type of Home: Group Home Home Access: Stairs to enter   Entrance Stairs-Number of Steps: "several" Home Layout: One level Home Equipment: None      Prior Function Level of Independence: Needs assistance   Gait / Transfers Assistance Needed: typically independent for mobility  ADL's / Homemaking Assistance Needed: Caregiver assist with bathing and meal prep        Hand Dominance   Dominant Hand: Right    Extremity/Trunk Assessment   Upper Extremity Assessment: Overall WFL for tasks assessed           Lower Extremity Assessment: Overall WFL for tasks assessed         Communication   Communication: Other (comment) (Pt repeats what is said)  Cognition Arousal/Alertness: Awake/alert Behavior During Therapy: WFL for tasks assessed/performed Overall Cognitive Status: History of cognitive impairments - at baseline (Pt able to follow 1 step commands only)  General Comments General comments (skin integrity, edema, etc.): incision dressing clean    Exercises        Assessment/Plan    PT Assessment Patient needs continued PT services  PT Diagnosis  Difficulty walking;Abnormality of gait;Acute pain   PT Problem List Decreased strength;Decreased range of motion;Decreased activity tolerance;Decreased balance;Decreased mobility;Decreased cognition;Decreased safety awareness;Decreased knowledge of precautions;Pain  PT Treatment Interventions DME instruction;Gait training;Stair training;Functional mobility training;Therapeutic activities;Therapeutic exercise;Balance training;Patient/family education   PT Goals (Current goals can be found in the Care Plan section) Acute Rehab PT Goals Patient Stated Goal: per caregiver, to get patient to independent levels PT Goal Formulation: With patient/family Time For Goal Achievement: 08/17/14 Potential to Achieve Goals: Good    Frequency Min 5X/week   Barriers to discharge Decreased caregiver support      Co-evaluation               End of Session Equipment Utilized During Treatment: Gait belt;Back brace Activity Tolerance: Patient tolerated treatment well;Patient limited by fatigue;Patient limited by pain Patient left: in chair;with call bell/phone within reach;with chair alarm set;with family/visitor present Nurse Communication: Mobility status         Time: [id number] PT Time Calculation (min) (ACUTE ONLY): 18 min   Charges:   PT Evaluation $Initial PT Evaluation Tier I: 1 Procedure     PT G CodesFabio Asa:        Paxten Appelt J 08/03/2014, 12:22 PM  Charlotte Crumbevon Keiron Iodice, PT DPT  [phone]

## 2014-08-03 NOTE — Progress Notes (Signed)
Patient hypotensive. Pulse 98. Patient otherwise asymptomatic, patient's caretaker states patient's behavior is at baseline. MD notified. MD increased IV fluids to 100 cc/hr. Will recheck vital signs. Patient is now sitting in chair, with caretaker at patient's side.

## 2014-08-03 NOTE — Progress Notes (Signed)
Utilization review completed.  

## 2014-08-03 NOTE — Progress Notes (Signed)
OT Note: Addendum  Per conversation with caregiver and PT Devon, OT now recommending SNF upon d/c to increase safety and independence with ADLs.  Richardson Doppebecca Alva Kuenzel, OT Student

## 2014-08-03 NOTE — Evaluation (Signed)
Occupational Therapy Evaluation Patient Details Name: Kristina Foster MRN: [id number] DOB: July 31, 1965 Today's Date: 08/03/2014    History of Present Illness 49 y.o. female s/p L4-5 posterior lumbar interbody fusion. PMH: cognitive impairment and lives with caregiver   Clinical Impression   Patient is s/p L4-5 posterior lumbar fusion surgery resulting in functional limitations due to the deficits listed below (see OT problem list). Pt has baseline cognitive deficits and is able to follow simple 1-step commands only. Education provided on 3 main back precautions and how to don brace. Back handout left in room for caregiver. Pt able to complete basic ADLs with VC's to maintain precautions and sequencing. Pt reports living in group home with caregiver. Patient will benefit from skilled OT acutely to increase independence and safety with ADLS to allow discharge home.     Follow Up Recommendations  No OT follow up;Supervision/Assistance - 24 hour    Equipment Recommendations  3 in 1 bedside comode    Recommendations for Other Services       Precautions / Restrictions Precautions Precautions: Back Precaution Booklet Issued: Yes (comment) Precaution Comments: handout left in room for caregiver; 3 main precautions reviewed with Pt Required Braces or Orthoses: Spinal Brace Spinal Brace: Lumbar corset;Applied in sitting position Restrictions Weight Bearing Restrictions: No      Mobility Bed Mobility Overal bed mobility: Needs Assistance Bed Mobility: Rolling;Sidelying to Sit Rolling: Mod assist Sidelying to sit: Mod assist       General bed mobility comments: cuing for technique  Transfers Overall transfer level: Needs assistance Equipment used: Rolling walker (2 wheeled) Transfers: Sit to/from Stand Sit to Stand: Min assist              Balance Overall balance assessment: Needs assistance Sitting-balance support: Feet supported       Standing balance support: No  upper extremity supported;During functional activity                                ADL Overall ADL's : Needs assistance/impaired Eating/Feeding: Independent;Sitting   Grooming: Wash/dry hands;Min guard;Cueing for sequencing;Standing   Upper Body Bathing: Minimal assitance;Sitting (cuing for precautions)   Lower Body Bathing: Moderate assistance;Cueing for back precautions;Sit to/from stand   Upper Body Dressing : Moderate assistance;Sitting;Cueing for sequencing (don brace)   Lower Body Dressing: Maximal assistance;Cueing for back precautions;Sit to/from stand   Toilet Transfer: Min guard;Cueing for safety;Ambulation;BSC;RW   Toileting- Clothing Manipulation and Hygiene: Cueing for back precautions;Min guard;Sit to/from stand       Functional mobility during ADLs: Min guard;Rolling walker General ADL Comments: Pt able to complete ADLs when given simple 1 step commands; requires VC's for sequencing "use soap, rinse hands"     Vision Vision Assessment?: No apparent visual deficits   Perception     Praxis      Pertinent Vitals/Pain Pain Assessment: Faces Faces Pain Scale: Hurts even more Pain Location: back Pain Descriptors / Indicators: Grimacing Pain Intervention(s): Monitored during session;Repositioned;Relaxation     Hand Dominance Right   Extremity/Trunk Assessment Upper Extremity Assessment Upper Extremity Assessment: Overall WFL for tasks assessed   Lower Extremity Assessment Lower Extremity Assessment: Overall WFL for tasks assessed       Communication Communication Communication: Other (comment) (Pt repeats what is said)   Cognition Arousal/Alertness: Awake/alert Behavior During Therapy: WFL for tasks assessed/performed Overall Cognitive Status: History of cognitive impairments - at baseline (Pt able to follow 1 step commands only)  General Comments       Exercises       Shoulder Instructions       Home Living Family/patient expects to be discharged to:: Unsure Living Arrangements: Group Home Available Help at Discharge: Other (Comment) (Unknown; caregiver not present) Type of Home: Group Home Home Access: Stairs to enter Entrance Stairs-Number of Steps: "several"   Home Layout: One level     Bathroom Shower/Tub: Tub/shower unit Shower/tub characteristics: Curtain       Home Equipment: None          Prior Functioning/Environment Level of Independence: Needs assistance    ADL's / Homemaking Assistance Needed: Caregiver assist with bathing and meal prep        OT Diagnosis: Generalized weakness;Cognitive deficits;Acute pain   OT Problem List: Decreased strength;Decreased activity tolerance;Impaired balance (sitting and/or standing);Decreased safety awareness;Decreased knowledge of use of DME or AE;Decreased knowledge of precautions;Pain   OT Treatment/Interventions: Self-care/ADL training;Therapeutic exercise;DME and/or AE instruction;Therapeutic activities;Patient/family education;Balance training    OT Goals(Current goals can be found in the care plan section) Acute Rehab OT Goals Patient Stated Goal: none given OT Goal Formulation: With patient Time For Goal Achievement: 08/17/14 ADL Goals Pt Will Perform Upper Body Bathing: with min assist;sitting Pt Will Perform Upper Body Dressing: with min guard assist;sitting (don brace) Pt Will Perform Lower Body Dressing: with min assist;sit to/from stand;with adaptive equipment Pt Will Perform Tub/Shower Transfer: Tub transfer;with min assist;ambulating;3 in 1;rolling walker Additional ADL Goal #1: Pt will verbalize 3/3 precautions as precursor to ADLs  OT Frequency: Min 2X/week   Barriers to D/C:            Co-evaluation              End of Session Equipment Utilized During Treatment: Gait belt;Rolling walker;Back brace Nurse Communication: Mobility status;Precautions  Activity Tolerance: Patient  tolerated treatment well Patient left: in chair;with call bell/phone within reach;with chair alarm set   Time: 0835-0900 OT Time Calculation (min): 25 min Charges:  OT General Charges $OT Visit: 1 Procedure OT Evaluation $Initial OT Evaluation Tier I: 1 Procedure OT Treatments $Self Care/Home Management : 8-22 mins G-Codes:    Marden NobleMary Rebecca Johnryan Sao 08/03/2014, 10:16 AM

## 2014-08-03 NOTE — Progress Notes (Signed)
Patient's abdomen noted to be very distended, tender to touch. No rebound tenderness noted. Patient's caretaker stated patient has  hadno bowel movement since 7/9. Patient noted tenderness over bladder. Patient bladder scanned, one bladder scanner reading 0mL, other bladder scanner reading 95mL in bladder. Patient in and out catheterized, 400 mL drained from bladder. RN adminitered miralax and dulcolax suppository. MD reported to bedside. Aware of patient's hypotension earlier today, asymptomatic. Patient was bolused earlier with 1 liter of NS, blood pressure now 118/86, pulse 88.

## 2014-08-03 NOTE — Progress Notes (Signed)
No issues overnight. Pt has asymptomatic hypotension. C/O back and abdominal pain.  EXAM:  BP 86/48 mmHg  Pulse 98  Temp(Src) 98.6 F (37 C) (Oral)  Resp 15  Ht 5\' 5"  (1.651 m)  Wt 78.019 kg (172 lb)  BMI 28.62 kg/m2  SpO2 95%  Awake, alert Moves BLE, but c/o pain Abd distended, no guarding or rebound tenderness  IMPRESSION:  49 y.o. female POD#1 s/p L4-5 PLIF, appears neurologically at baseline - abd distension likely from constipation, ? Urinary retention  PLAN: - Will check CBC, lytes - Straight cath x1 - Cont laxatives - Will add meal coverage insulin and diet to carb modified

## 2014-08-03 NOTE — Clinical Social Work Note (Signed)
Patient has a bed at SNF, [facility] once medically stable for discharge. CSW will continue to follow patient and patient's family for continued support and to facilitate patient's discharge needs once medically stable.  CSW remains available as needed. FL-2 signed.   Derenda FennelBashira Maryum Batterson, MSW, LCSWA [phone] 08/03/2014 3:52 PM

## 2014-08-03 NOTE — Progress Notes (Signed)
[employer] called for brace. PT to be notified when brace is with patient.

## 2014-08-03 NOTE — Progress Notes (Signed)
OT NOTE    Order in order set for brace currently. NO brace present in room. OT called ortho tech [phone])  to order brace from [employer] tech at 7:48AM. OT to return to complete evaluation upon brace arrival.   Kristina Foster, Kristina Foster   OTR/L Pager: [phone] Office: [phone] .

## 2014-08-03 NOTE — Clinical Social Work Note (Signed)
Clinical Social Work Assessment  Patient Details  Name: Kristina Foster MRN: [id number] Date of Birth: 1965-10-28  Date of referral:  08/03/14               Reason for consult:  Facility Placement, Discharge Planning                Permission sought to share information with:  Case Manager, Facility Medical sales representative, PCP, Family Supports Permission granted to share information::  Yes, Verbal Permission Granted  Name::      [name] [place] )  Agency::   (SNF's in [place] )  Relationship::   (Caregiver/Family )  Contact Information:   [phone])  Housing/Transportation Living arrangements for the past 2 months:  Single Family Home (Family Care Home ) Source of Information:   (Caregiver ) Patient Interpreter Needed:  None Criminal Activity/Legal Involvement Pertinent to Current Situation/Hospitalization:  No - Comment as needed Significant Relationships:   Programmer, systems ) Lives with:   (Caregiver ) Do you feel safe going back to the place where you live?  Yes Need for family participation in patient care:  Yes (Comment)  Care giving concerns:  Patient's caregiver of 35 years is agreeable that patient will benefit from short-term rehab within a SNF.   Social Worker assessment / plan:  Visual merchandiser spoke with patient's caregiver, Reece Leader at length in reference to post-acute placement for SNF. CSW introduced CSW role and SNF process. CSW also reviewed SNF list thoroughly. Patient's caregiver, Ms. [place] reported that she has been pt's caregiver for the past 35 years. Ms. Dan Humphreys reported that she owns [facility] and stated that the pt is like her family and now lives with her in her private home. Pt's caregiver further reported that the pt does not have family present due to pt's mom and siblings being residents of SNF's. Caregiver goes on to report that the patient is like her daughter and will always consider pt as family. Pt's caregiver further  reported that she is a retired Buyer, retail. Pt's caregiver also stated she prefers [facility] due to pt's PCP being the MD of the facility. Pt's caregiver also presented second and third options: Administrator and [facility]. Pt's caregiver asked several questions in reference to process of discharge planning such as transportation and CSW reviewed discharge planning process and available PTAR transportation. CSW also reviewed Medicare guidelines and potential coverage for 20 days. CSW completed FL-2 and faxed to [place] SNF's for review. Pt's caregiver wishes for pt to receive the best care and reported she is so far pleased with care at [facility]. No further concerns reported at this time by caregiver. CSW will continue to follow pt and pt's family for continued support and to facilitate pt's discharge needs once medically stable.   Employment status:  Disabled (Comment on whether or not currently receiving Disability) Insurance information:  Medicare, Medicaid In [place] PT Recommendations:  Skilled Nursing Facility Information / Referral to community resources:  Skilled Nursing Facility  Patient/Family's Response to care:  Pt lying in bed asleep. Pt's caregiver of 35 years agreeable to short-term placement at [facility]. Pt's caregiver does not wish for patient to be placed long-term. Pt's caregiver further stated she will take care of pt as long as she is physically able to do so. Pt's caregiver very involved and supportive in pt's care. Pt's caregiver pleasant, accepting and appreciated social work intervention.   Patient/Family's Understanding of  and Emotional Response to Diagnosis, Current Treatment, and Prognosis:  Pt's caregiver reported pt received an ultrasound and IV antibiotics today. Pt's caregiver also reported that pt is receiving O2 currently due to SOB. Pt's caregiver knowledgeable and understanding of pt's diagnosis and on going treatment.  CSW remains available as needed.   Emotional Assessment Appearance:  Appears stated age Attitude/Demeanor/Rapport:  Unable to Assess Affect (typically observed):  Unable to Assess Orientation:  Oriented to Self Alcohol / Substance use:  Never Used Psych involvement (Current and /or in the community):  No (Comment)  Discharge Needs  Concerns to be addressed:  No discharge needs identified Readmission within the last 30 days:  No Current discharge risk:  None Barriers to Discharge:  No Barriers Identified   Derenda FennelBashira Mardel Grudzien, MSW, LCSWA [phone] 08/03/2014 3:47 PM

## 2014-08-03 NOTE — Progress Notes (Signed)
Inpatient Diabetes Program Recommendations  AACE/ADA: New Consensus Statement on Inpatient Glycemic Control (2013)  Target Ranges:  Prepandial:   less than 140 mg/dL      Peak postprandial:   less than 180 mg/dL (1-2 hours)      Critically ill patients:  140 - 180 mg/dL  Results for Kristina Foster, Kristina Foster (MRN [id number]) as of 08/03/2014 14:12  Ref. Range 08/02/2014 09:38 08/02/2014 15:35 08/02/2014 21:45 08/03/2014 06:04 08/03/2014 11:48  Glucose-Capillary Latest Ref Range: 65-99 mg/dL 409103 (H) 811104 (H) 914260 (H) 130 (H) 326 (H)   Diabetes history: DM 2 Outpatient Diabetes medications: Glipizide 10 mg Daily, Lantus 20 units QHS Current orders for Inpatient glycemic control: Glipizide 10 mg Daily, Lantus 20 units QHS, Novolog Moderate scale (0-15 units)TID  Inpatient Diabetes Program Recommendations Insulin - Meal Coverage: Glucose from 130 this am to over 300 at lunch. Please consider Novolog 3 units Meal Coverage TID in addition to correction scale. Diet: Please change diet to Carb Modified.  Thanks,  Christena DeemShannon Alura Olveda RN, MSN, [facility] Inpatient Diabetes Coordinator Team Pager [phone]

## 2014-08-03 NOTE — Progress Notes (Signed)
PT Cancellation Note  Patient Details Name: Kristina Foster MRN: [id number] DOB: 01/11/1966   Cancelled Treatment:    Reason Eval/Treat Not Completed: Patient not medically ready (no brace available). PT will return to complete evaluation upon brace arrival.  [name], [name] J 08/03/2014, 7:56 AM [name], PT DPT  [phone]

## 2014-08-03 NOTE — Clinical Social Work Placement (Signed)
   CLINICAL SOCIAL WORK PLACEMENT  NOTE  Date:  08/03/2014  Patient Details  Name: Kristina Foster MRN: [id number] Date of Birth: 08/21/1965  Clinical Social Work is seeking post-discharge placement for this patient at the Skilled  Nursing Facility level of care (*CSW will initial, date and re-position this form in  chart as items are completed):  Yes   Patient/family provided with [place] Clinical Social Work Department's list of facilities offering this level of care within the geographic area requested by the patient (or if unable, by the patient's family).  Yes   Patient/family informed of their freedom to choose among providers that offer the needed level of care, that participate in Medicare, Medicaid or managed care program needed by the patient, have an available bed and are willing to accept the patient.  Yes   Patient/family informed of [facility] ownership interest in [facility] and [facility], as well as of the fact that they are under no obligation to receive care at these facilities.  PASRR submitted to EDS on 08/03/14     PASRR number received on 08/03/14     Existing PASRR number confirmed on       FL2 transmitted to all facilities in geographic area requested by pt/family on 08/03/14     FL2 transmitted to all facilities within larger geographic area on       Patient informed that his/her managed care company has contracts with or will negotiate with certain facilities, including the following:        Yes   Patient/family informed of bed offers received.  Patient chooses bed at  [facility] )     Physician recommends and patient chooses bed at      Patient to be transferred to  [facility] on  .  Patient to be transferred to facility by       Patient family notified on   of transfer.  Name of family member notified:        PHYSICIAN Please prepare prescriptions     Additional Comment:     _______________________________________________ Kristina Foster, MSW, LCSWA [phone] 08/03/2014 3:50 PM

## 2014-08-04 LAB — GLUCOSE, CAPILLARY
GLUCOSE-CAPILLARY: 213 mg/dL — AB (ref 65–99)
GLUCOSE-CAPILLARY: 224 mg/dL — AB (ref 65–99)
Glucose-Capillary: 187 mg/dL — ABNORMAL HIGH (ref 65–99)

## 2014-08-04 MED ORDER — SENNA 8.6 MG PO TABS
1.0000 | ORAL_TABLET | Freq: Two times a day (BID) | ORAL | Status: DC
  Administered 2014-08-05: 8.6 mg via ORAL
  Filled 2014-08-04 (×2): qty 1

## 2014-08-04 MED ORDER — DOCUSATE SODIUM 100 MG PO CAPS
100.0000 mg | ORAL_CAPSULE | Freq: Two times a day (BID) | ORAL | Status: DC
  Administered 2014-08-05: 100 mg via ORAL
  Filled 2014-08-04 (×2): qty 1

## 2014-08-04 NOTE — Progress Notes (Signed)
Last documented urine was a craterization, so RN confirmed with NT that pt has not voided this shift. Bladder scanned for 977 and I&O cath for 1900ml. Abdomen distended and tender to touch. Will continue to monitor. Rudie MeyerEurillo, Alexa Golebiewski A, RN

## 2014-08-04 NOTE — Progress Notes (Signed)
PT Cancellation Note  Patient Details Name: Kristina Foster A Filler MRN: [id number] DOB: 08/13/1965   Cancelled Treatment:    Reason Eval/Treat Not Completed: Other (comment) Attempted to see patient, nsg currently attempting suppository, will hold at this time and try back if time permits.   [name], Shaina Gullatt J 08/04/2014, 11:38 AM  Charlotte Crumbevon Jamien Casanova, PT DPT  [phone]

## 2014-08-04 NOTE — Progress Notes (Signed)
No issues overnight. Pt did require straight cath with 1900cc obtained early this am. Doing well today, reporting continued abd and back pain but improved from yesterday.  EXAM:  BP 129/79 mmHg  Pulse 72  Temp(Src) 99.1 F (37.3 C) (Oral)  Resp 20  Ht 5\' 5"  (1.651 m)  Wt 78.019 kg (172 lb)  BMI 28.62 kg/m2  SpO2 98%  Awake, alert, oriented  Speech fluent, appropriate  CN grossly intact  5/5 BUE/BLE  Wound c/d/i  IMPRESSION:  49 y.o. female POD# 2 s/p L4-5 PLIF, recovering slowly. - postop urinary retention  PLAN: - Cont therapy - straight cath PRN - Will likely be stable for d/c to SNF maybe tomorrow.

## 2014-08-04 NOTE — Care Management Important Message (Signed)
Important Message  Patient Details  Name: Kristina Foster MRN: [id number] Date of Birth: 12/09/1965   Medicare Important Message Given:  Yes-second notification given    Orson AloeMegan P Farzana Koci 08/04/2014, 1:50 PM

## 2014-08-04 NOTE — Progress Notes (Signed)
Occupational Therapy Treatment Patient Details Name: Kristina Foster MRN: [id number] DOB: 1966-01-03 Today's Date: 08/04/2014    History of present illness 49 y.o. female s/p L4-5 posterior lumbar interbody fusion. PMH: cognitive impairment and lives with caregiver   OT comments  Pt sitting in recliner upon arrival not wearing brace. Pt unable to recall precautions or how to don brace. Caregiver and Pt's roommate present during therapy session. Education provided to caregiver on Pt's precautions and need to wear brace; caregiver replies "why do I need to know this, I'm a guest".  OT explained these precautions would last ~ 8-12 weeks depending upon MD. Pt able to assist with donning brace with hand over hand guiding. Pt able to follow simple 1-step commands, unable to sequence tasks or complete multi-step commands. Recommending SNF for increased safety and independence with ADLs as Pt has cognitive deficits at baseline and has decreased caregiver support.    Follow Up Recommendations  Supervision/Assistance - 24 hour;SNF    Equipment Recommendations  Other (comment) (TBD next venue)    Recommendations for Other Services      Precautions / Restrictions Precautions Precautions: Back Precaution Booklet Issued: Yes (comment) Precaution Comments: handout reviewed with caregiver; pt unable to recall precautions Required Braces or Orthoses: Spinal Brace Spinal Brace: Lumbar corset;Applied in sitting position Restrictions Weight Bearing Restrictions: No       Mobility Bed Mobility               General bed mobility comments: in chair upon arrival  Transfers Overall transfer level: Needs assistance Equipment used: Rolling walker (2 wheeled) Transfers: Sit to/from Stand Sit to Stand: Min guard         General transfer comment: Pt required increased time and VCs for hand placement    Balance Overall balance assessment: Needs assistance Sitting-balance support: Feet  supported;No upper extremity supported       Standing balance support: No upper extremity supported;During functional activity                       ADL Overall ADL's : Needs assistance/impaired     Grooming: Wash/dry hands;Supervision/safety;Standing;Cueing for sequencing           Upper Body Dressing : Sitting;Maximal assistance (don brace) Upper Body Dressing Details (indicate cue type and reason): Pt sitting in chair upon OT arrival without brace; pt able to assist with donning brace using hand over hand guiding     Toilet Transfer: Min guard;Ambulation;BSC;RW   Toileting- Clothing Manipulation and Hygiene: Modified independent;Sit to/from stand       Functional mobility during ADLs: Supervision/safety;Rolling walker General ADL Comments: Pt able to complete ADLs when given simple 1 step commands; requires VC's for sequencing "use soap, rinse hands"      Vision                     Perception     Praxis      Cognition   Behavior During Therapy: [facility] for tasks assessed/performed Overall Cognitive Status: History of cognitive impairments - at baseline       Memory: Decreased recall of precautions               Extremity/Trunk Assessment               Exercises     Shoulder Instructions       General Comments      Pertinent Vitals/ Pain  Pain Assessment: Faces Faces Pain Scale: Hurts whole lot Pain Location: back Pain Descriptors / Indicators: Grimacing;Guarding Pain Intervention(s): Limited activity within patient's tolerance;Monitored during session;Repositioned;Patient requesting pain meds-RN notified;Relaxation  Home Living                                          Prior Functioning/Environment              Frequency Min 2X/week     Progress Toward Goals  OT Goals(current goals can now be found in the care plan section)  Progress towards OT goals: Progressing toward goals  Acute  Rehab OT Goals Patient Stated Goal: none given OT Goal Formulation: With patient Time For Goal Achievement: 08/17/14 ADL Goals Pt Will Perform Upper Body Bathing: with min assist;sitting Pt Will Perform Upper Body Dressing: with min guard assist;sitting Pt Will Perform Lower Body Dressing: with min assist;sit to/from stand;with adaptive equipment Pt Will Perform Tub/Shower Transfer: Tub transfer;with min assist;ambulating;3 in 1;rolling walker Additional ADL Goal #1: Pt will verbalize 3/3 precautions as precursor to ADLs  Plan Discharge plan remains appropriate    Co-evaluation                 End of Session Equipment Utilized During Treatment: Gait belt;Rolling walker;Back brace   Activity Tolerance Patient tolerated treatment well   Patient Left in chair;with call bell/phone within reach;with chair alarm set;with family/visitor present   Nurse Communication Mobility status;Precautions;Other (comment) (O2 at 98 percent on room air)        Time: 1610-96041308-1337 OT Time Calculation (min): 29 min  Charges: OT General Charges $OT Visit: 1 Procedure OT Treatments $Self Care/Home Management : 23-37 mins  Marden NobleMary Rebecca Shaleena Crusoe 08/04/2014, 1:58 PM

## 2014-08-04 NOTE — Progress Notes (Signed)
Inpatient Diabetes Program Recommendations  AACE/ADA: New Consensus Statement on Inpatient Glycemic Control (2013)  Target Ranges:  Prepandial:   less than 140 mg/dL      Peak postprandial:   less than 180 mg/dL (1-2 hours)      Critically ill patients:  140 - 180 mg/dL   Results for Kristina Foster, Kristina Foster (MRN [id number]) as of 08/04/2014 11:29  Ref. Range 08/03/2014 16:54 08/03/2014 21:46 08/04/2014 06:12  Glucose-Capillary Latest Ref Range: 65-99 mg/dL 409171 (H) 811257 (H) 914213 (H)   Diabetes history: DM 2 Outpatient Diabetes medications: Glipizide 10 mg Daily, Lantus 20 units QHS Current orders for Inpatient glycemic control: Glipizide 10 mg Daily, Lantus 20 units QHS, Novolog Moderate scale (0-15 units)TID, Novolog 3 units meal coverage  Inpatient Diabetes Program Recommendations Insulin - Basal: Fasting glucose was 213 mg/dl. Consider increasing basal insulin to Lantus 24-26 units QHS. Insulin - Meal Coverage: Glucose still increased after the addition of 3 units of Novolog meal coverage. Please consider increasing meal coverage to Novolog 5 units TID.  Thanks,  Christena DeemShannon Mechel Schutter RN, MSN, [facility]CCN Inpatient Diabetes Coordinator Team Pager [phone]

## 2014-08-05 LAB — GLUCOSE, CAPILLARY
GLUCOSE-CAPILLARY: 259 mg/dL — AB (ref 65–99)
Glucose-Capillary: 187 mg/dL — ABNORMAL HIGH (ref 65–99)
Glucose-Capillary: 200 mg/dL — ABNORMAL HIGH (ref 65–99)
Glucose-Capillary: 191 mg/dL — ABNORMAL HIGH (ref 65–99)

## 2014-08-05 MED ORDER — DIAZEPAM 5 MG PO TABS: 5.0000 mg | ORAL_TABLET | Freq: Four times a day (QID) | ORAL | Status: DC | PRN

## 2014-08-05 MED ORDER — OXYCODONE-ACETAMINOPHEN 5-325 MG PO TABS: 1.0000 | ORAL_TABLET | ORAL | Status: DC | PRN

## 2014-08-05 NOTE — Clinical Social Work Note (Signed)
CSW spoke with the pt's care giver Britta MccreedyBarbara at the bedside. CSW confirmed that the pt still has a bed at [facility]. CSW encouraged Britta MccreedyBarbara to call [facility] to completed the admission paperwork. CSW and Britta MccreedyBarbara also discussed transportation options once the pt is medically stable. CSW will continue to follow and assist pt with discharge needs.    Quientin Jent, MSW, LCSWA [phone]

## 2014-08-05 NOTE — Progress Notes (Signed)
EMS PT[facility] picked up pt for transport to [facility], no obvious distress from pt upon leaving, care transferred to EMS

## 2014-08-05 NOTE — Discharge Summary (Signed)
Physician Discharge Summary  Patient ID: Kristina Foster MRN: [id number] DOB/AGE: 49/06/1965 49 y.o.  Admit date: 08/02/2014 Discharge date: 08/05/2014  Admission Diagnoses: Lumbar spondylosis, L4-5  Discharge Diagnoses: Same Active Problems:   Lumbar spondylosis   Discharged Condition: Stable  Hospital Course:  Mrs. Kristina Foster is a 49 y.o. female electively admitted after L4-5 PLIF. She resides in a home with a caregiver for the last 1012yrs. Her postop course was uneventful, with progressive improvement in her back and leg pain. She was ambulating well with assistance, tolerating diet. She did require straight cath x2 postop, but was voiding normally after that.   Treatments: Surgery - L4-5 PLIF  Discharge Exam: Blood pressure 138/96, pulse 96, temperature 97.7 F (36.5 C), temperature source Oral, resp. rate 18, height 5\' 5"  (1.651 m), weight 78.019 kg (172 lb), SpO2 100 %. Awake, alert, oriented Speech fluent, appropriate CN grossly intact 5/5 BUE/BLE Wound c/d/i  Disposition: SNF     Medication List    STOP taking these medications        HYDROcodone-acetaminophen 5-325 MG per tablet  Commonly known as:  NORCO/VICODIN      TAKE these medications        aspirin 81 MG tablet  Take 81 mg by mouth daily.     atorvastatin 10 MG tablet  Commonly known as:  LIPITOR  Take 10 mg by mouth daily.     benztropine 2 MG tablet  Commonly known as:  COGENTIN  Take 2 mg by mouth 2 (two) times daily.     colesevelam 625 MG tablet  Commonly known as:  WELCHOL  Take by mouth 2 (two) times daily with a meal.     diazepam 5 MG tablet  Commonly known as:  VALIUM  Take 1 tablet (5 mg total) by mouth every 6 (six) hours as needed for muscle spasms.     glipiZIDE 10 MG tablet  Commonly known as:  GLUCOTROL  Take 10 mg by mouth daily before breakfast.     insulin glargine 100 UNIT/ML injection  Commonly known as:  LANTUS  Inject 20 Units into the skin at bedtime.     lisinopril-hydrochlorothiazide 10-12.5 MG per tablet  Commonly known as:  PRINZIDE,ZESTORETIC  Take 1 tablet by mouth daily.     lubiprostone 24 MCG capsule  Commonly known as:  AMITIZA  Take 24 mcg by mouth 2 (two) times daily with a meal.     magnesium hydroxide 400 MG/5ML suspension  Commonly known as:  MILK OF MAGNESIA  Take by mouth daily as needed for mild constipation.     metoprolol succinate 100 MG 24 hr tablet  Commonly known as:  TOPROL-XL  Take 100 mg by mouth daily.     oxyCODONE-acetaminophen 5-325 MG per tablet  Commonly known as:  PERCOCET/ROXICET  Take 1-2 tablets by mouth every 4 (four) hours as needed for moderate pain.     polyethylene glycol packet  Commonly known as:  MIRALAX / GLYCOLAX  Take 17 g by mouth 2 (two) times a week.     risperiDONE 1 MG tablet  Commonly known as:  RISPERDAL  Take 1 mg by mouth at bedtime.     venlafaxine 75 MG tablet  Commonly known as:  EFFEXOR  Take 75 mg by mouth 2 (two) times daily.           Follow-up Information    [facility] C, MD In 3 weeks.   Specialty:  Neurosurgery   Contact information:   [address] [place] [address] [phone]       Signed: Lisbeth Renshaw, Salena Saner 08/05/2014, 4:03 PM

## 2014-08-05 NOTE — Clinical Social Work Placement (Signed)
   CLINICAL SOCIAL WORK PLACEMENT  NOTE  Date:  08/05/2014  Patient Details  Name: Kristina Foster MRN: [id number] Date of Birth: 12/01/1965  Clinical Social Work is seeking post-discharge placement for this patient at the Skilled  Nursing Facility level of care (*CSW will initial, date and re-position this form in  chart as items are completed):  Yes   Patient/family provided with [facility] Clinical Social Work Department's list of facilities offering this level of care within the geographic area requested by the patient (or if unable, by the patient's family).  Yes   Patient/family informed of their freedom to choose among providers that offer the needed level of care, that participate in Medicare, Medicaid or managed care program needed by the patient, have an available bed and are willing to accept the patient.  Yes   Patient/family informed of [facility]'s ownership interest in [facility] and [facility], as well as of the fact that they are under no obligation to receive care at these facilities.  PASRR submitted to EDS on 08/03/14     PASRR number received on 08/03/14     Existing PASRR number confirmed on       FL2 transmitted to all facilities in geographic area requested by pt/family on 08/03/14     FL2 transmitted to all facilities within larger geographic area on       Patient informed that his/her managed care company has contracts with or will negotiate with certain facilities, including the following:        Yes   Patient/family informed of bed offers received.  Patient chooses bed at  [facility] )     Physician recommends and patient chooses bed at      Patient to be transferred to  [facility]) on 08/05/14.  Patient to be transferred to facility by PTAR      Patient family notified on 08/05/14 of transfer.  Name of family member notified:  Reece LeaderBarbara [place], caregiver      PHYSICIAN       Additional Comment:     _______________________________________________ Gwynne EdingerBibbs, Zenovia Justman, LCSW 08/05/2014, 4:07 PM

## 2014-08-05 NOTE — Progress Notes (Addendum)
Report given to Nurse Charrell (sp?) at [facility]. Caretaker aware of patient's disposition. Awaiting for transport.   Sim BoastHavy, RN

## 2014-08-05 NOTE — Progress Notes (Signed)
PAged MD about in and out cath of 1800 and copious amount of blood tinged vaginal mucous plug. Sample obtained if it needs to be tested. Will alert oncoming nurse to further pursue the issue. Suzy Bouchardhompson, Kenika Sahm E, RN 08/05/2014 0600

## 2014-08-05 NOTE — Progress Notes (Signed)
Patient voided 1100ml clear yellow urine at this time. No other distress noted. Will continue to monitor.  Sim BoastHavy, RN

## 2014-08-05 NOTE — Progress Notes (Signed)
Physical Therapy Treatment Patient Details Name: Kristina Foster MRN: [id number] DOB: 01/12/1966 Today's Date: 08/05/2014    History of Present Illness 49 y.o. female s/p L4-5 posterior lumbar interbody fusion. PMH: cognitive impairment and lives with caregiver    PT Comments    Patient seen for mobility this afternoon. Patient with increased pain but was able to tolerate some ambulation activity. Patient continues to require assist for all aspects of mobility as well as assist for donning/doffing brace.  Poor compliance and carry over with precautions at this time. Will need continued skilled PT to reinforce technique,  precautions and safety with mobility.   Follow Up Recommendations  SNF     Equipment Recommendations  Rolling walker with 5" wheels    Recommendations for Other Services       Precautions / Restrictions Precautions Precautions: Back Precaution Booklet Issued: Yes (comment) Precaution Comments: handout left in room for caregiver; 3 main precautions reviewed with Pt Required Braces or Orthoses: Spinal Brace Spinal Brace: Lumbar corset;Applied in sitting position Restrictions Weight Bearing Restrictions: No    Mobility  Bed Mobility Overal bed mobility: Needs Assistance Bed Mobility: Rolling;Sidelying to Sit;Sit to Sidelying Rolling: Min assist Sidelying to sit: Mod assist     Sit to sidelying: Mod assist General bed mobility comments: cuing for technique, assist for elevation to upright at EOB. Assist for LE elevation to return to bed.  Transfers Overall transfer level: Needs assistance Equipment used: Rolling walker (2 wheeled) Transfers: Sit to/from Stand Sit to Stand: Min assist         General transfer comment: Patient hesitant during transitions secondary to pain  Ambulation/Gait Ambulation/Gait assistance: Min assist Ambulation Distance (Feet): 110 Feet Assistive device: Rolling walker (2 wheeled) Gait Pattern/deviations: Step-through  pattern;Decreased stride length;Narrow base of support Gait velocity: decreased   General Gait Details: patient required cues for upright posture, assist for postioning and stability, assist to steer RW at times.    Stairs            Wheelchair Mobility    Modified Rankin (Stroke Patients Only)       Balance                                    Cognition Arousal/Alertness: Awake/alert Behavior During Therapy: WFL for tasks assessed/performed Overall Cognitive Status: History of cognitive impairments - at baseline (Pt able to follow 1 step commands only)                      Exercises      General Comments        Pertinent Vitals/Pain Pain Assessment: Faces Faces Pain Scale: Hurts even more Pain Location: back Pain Descriptors / Indicators: Grimacing;Guarding Pain Intervention(s): Limited activity within patient's tolerance;Monitored during session;Repositioned;Patient requesting pain meds-RN notified;Relaxation    Home Living                      Prior Function            PT Goals (current goals can now be found in the care plan section) Acute Rehab PT Goals Patient Stated Goal: none given PT Goal Formulation: With patient/family Time For Goal Achievement: 08/17/14 Potential to Achieve Goals: Good Progress towards PT goals: Progressing toward goals    Frequency  Min 5X/week    PT Plan Current plan remains appropriate    Co-evaluation  End of Session Equipment Utilized During Treatment: Gait belt;Back brace Activity Tolerance: Patient limited by pain Patient left: in bed;with call bell/phone within reach;with bed alarm set     Time: 1410-1431 PT Time Calculation (min) (ACUTE ONLY): 21 min  Charges:  $Gait Training: 8-22 mins                    G CodesFabio Asa 08/18/2014, 3:32 PM Charlotte Crumb, PT DPT  [phone]

## 2014-11-03 ENCOUNTER — Other Ambulatory Visit: Payer: Self-pay | Admitting: Internal Medicine

## 2014-11-03 DIAGNOSIS — Z1231 Encounter for screening mammogram for malignant neoplasm of breast: Secondary | ICD-10-CM

## 2014-11-15 ENCOUNTER — Other Ambulatory Visit: Payer: Self-pay | Admitting: Internal Medicine

## 2014-11-15 ENCOUNTER — Ambulatory Visit
Admission: RE | Admit: 2014-11-15 | Discharge: 2014-11-15 | Disposition: A | Discharge status: Home / Self Care | Payer: Medicare Other | Source: Ambulatory Visit | Attending: Internal Medicine | Admitting: Internal Medicine

## 2014-11-15 DIAGNOSIS — Z1231 Encounter for screening mammogram for malignant neoplasm of breast: Secondary | ICD-10-CM

## 2015-11-06 ENCOUNTER — Other Ambulatory Visit: Payer: Self-pay | Admitting: Internal Medicine

## 2015-11-06 DIAGNOSIS — Z1231 Encounter for screening mammogram for malignant neoplasm of breast: Secondary | ICD-10-CM

## 2015-11-26 ENCOUNTER — Emergency Department
Admission: EM | Admit: 2015-11-26 | Discharge: 2015-11-26 | Disposition: A | Discharge status: Home / Self Care | Payer: Medicare Other | Attending: Emergency Medicine | Admitting: Emergency Medicine

## 2015-11-26 ENCOUNTER — Emergency Department: Payer: Medicare Other

## 2015-11-26 DIAGNOSIS — R109 Unspecified abdominal pain: Secondary | ICD-10-CM

## 2015-11-26 DIAGNOSIS — Z79899 Other long term (current) drug therapy: Secondary | ICD-10-CM | POA: Insufficient documentation

## 2015-11-26 DIAGNOSIS — Z794 Long term (current) use of insulin: Secondary | ICD-10-CM | POA: Diagnosis not present

## 2015-11-26 DIAGNOSIS — I10 Essential (primary) hypertension: Secondary | ICD-10-CM | POA: Diagnosis not present

## 2015-11-26 DIAGNOSIS — E119 Type 2 diabetes mellitus without complications: Secondary | ICD-10-CM | POA: Diagnosis not present

## 2015-11-26 DIAGNOSIS — K5900 Constipation, unspecified: Secondary | ICD-10-CM

## 2015-11-26 DIAGNOSIS — R1033 Periumbilical pain: Secondary | ICD-10-CM | POA: Diagnosis present

## 2015-11-26 LAB — URINALYSIS COMPLETE WITH MICROSCOPIC (ARMC ONLY)
BACTERIA UA: NONE SEEN
BILIRUBIN URINE: NEGATIVE
GLUCOSE, UA: NEGATIVE mg/dL
Hgb urine dipstick: NEGATIVE
Ketones, ur: NEGATIVE mg/dL
LEUKOCYTES UA: NEGATIVE
Nitrite: NEGATIVE
PH: 7 (ref 5.0–8.0)
Protein, ur: NEGATIVE mg/dL
Specific Gravity, Urine: 1.004 — ABNORMAL LOW (ref 1.005–1.030)

## 2015-11-26 LAB — COMPREHENSIVE METABOLIC PANEL
ALBUMIN: 4.3 g/dL (ref 3.5–5.0)
ALT: 29 U/L (ref 14–54)
AST: 24 U/L (ref 15–41)
Alkaline Phosphatase: 102 U/L (ref 38–126)
Anion gap: 8 (ref 5–15)
BUN: 9 mg/dL (ref 6–20)
CHLORIDE: 101 mmol/L (ref 101–111)
CO2: 30 mmol/L (ref 22–32)
Calcium: 9.6 mg/dL (ref 8.9–10.3)
Creatinine, Ser: 1.06 mg/dL — ABNORMAL HIGH (ref 0.44–1.00)
GFR calc Af Amer: >60 mL/min (ref >60)
GFR calc non Af Amer: >60 mL/min (ref >60)
GLUCOSE: 109 mg/dL — AB (ref 65–99)
POTASSIUM: 3.4 mmol/L — AB (ref 3.5–5.1)
Sodium: 139 mmol/L (ref 135–145)
Total Bilirubin: 0.6 mg/dL (ref 0.3–1.2)
Total Protein: 7.7 g/dL (ref 6.5–8.1)

## 2015-11-26 LAB — CBC
HEMATOCRIT: 34.8 % — AB (ref 35.0–47.0)
Hemoglobin: 12.6 g/dL (ref 12.0–16.0)
MCH: 32.4 pg (ref 26.0–34.0)
MCHC: 36.2 g/dL — AB (ref 32.0–36.0)
MCV: 89.5 fL (ref 80.0–100.0)
Platelets: 132 10*3/uL — ABNORMAL LOW (ref 150–440)
RBC: 3.89 MIL/uL (ref 3.80–5.20)
RDW: 13.6 % (ref 11.5–14.5)
WBC: 6.3 10*3/uL (ref 3.6–11.0)

## 2015-11-26 LAB — LIPASE, BLOOD: LIPASE: 18 U/L (ref 11–51)

## 2015-11-26 MED ORDER — ONDANSETRON 4 MG PO TBDP
4.0000 mg | ORAL_TABLET | Freq: Once | ORAL | Status: AC
  Administered 2015-11-26: 4 mg via ORAL
  Filled 2015-11-26: qty 1

## 2015-11-26 NOTE — ED Triage Notes (Signed)
Pt presents with reports of lower abdominal pain, nausea and vomiting for three days. Pt group home administrator states she has given her Miralax because she had not had a BM for three days. Reports large BM yesterday.

## 2015-11-26 NOTE — Discharge Instructions (Signed)
Please return immediately if condition worsens. Please contact her primary physician or the physician you were given for referral. If you have any specialist physicians involved in her treatment and plan please also contact them. Thank you for using [facility] regional emergency Department.   Would be over-the-counter magnesium citrate and/or enema.

## 2015-11-26 NOTE — ED Provider Notes (Signed)
Time Seen: Approximately1530  I have reviewed the triage notes  Chief Complaint: Abdominal Pain   History of Present Illness: Kristina Foster is a 50 y.o. female who presents with some umbilical abdominal pain that occurred the last 3 days. She had a decreased appetite and some occasional nausea and vomiting. She does exhibit signs of constipation with was small hardstools according to the group home supervisor see patient's primary historian due to history of schizophrenia and mental disabilities. She's recently been established on a new medication for her dystonic reaction approximately 6 weeks ago. There's been no history of fever or blood in the stool or any other new concerns. Patient had a rather large bowel movement earlier today.   Past Medical History:  Diagnosis Date  . Anxiety   . Arthritis   . Constipation   . Depression   . Diabetes mellitus without complication    Type 2   . Dizziness   . Hyperlipemia   . Hypertension   . Mental disability   . Schizo affective schizophrenia     Patient Active Problem List   Diagnosis Date Noted  . Lumbar spondylosis 08/02/2014    Past Surgical History:  Procedure Laterality Date  . ABDOMINAL HYSTERECTOMY      Past Surgical History:  Procedure Laterality Date  . ABDOMINAL HYSTERECTOMY      Current Outpatient Rx  . Order #: [id number] Class: Historical Med  . Order #: [id number] Class: Historical Med  . Order #: [id number] Class: Historical Med  . Order #: [id number] Class: Historical Med  . Order #: [id number] Class: Print  . Order #: [id number] Class: Historical Med  . Order #: [id number] Class: Historical Med  . Order #: [id number] Class: Historical Med  . Order #: [id number] Class: Historical Med  . Order #: [id number] Class: Historical Med  . Order #: [id number] Class: Historical Med  . Order #: [id number] Class: Print  . Order #: [id number] Class: Historical Med  . Order #: [id number] Class: Historical Med  . Order #:  [id number] Class: Historical Med    Allergies:  Patient has no known allergies.  Family History: No family history on file.  Social History: Social History  Substance Use Topics  . Smoking status: Never Smoker  . Smokeless tobacco: Not on file  . Alcohol use No     Review of Systems:   10 point review of systems was performed and was otherwise negative:  Constitutional: No fever Eyes: No visual disturbances ENT: No sore throat, ear pain Cardiac: No chest pain Respiratory: No shortness of breath, wheezing, or stridor Abdomen:Patient points to this historian primarily around the periumbilical region. No flank discomfort. Endocrine: No weight loss, No night sweats Extremities: No peripheral edema, cyanosis Skin: No rashes, easy bruising Neurologic: No focal weakness, trouble with speech or swollowing Urologic: No dysuria, Hematuria, or urinary frequency   Physical Exam:  ED Triage Vitals  Enc Vitals Group     BP 11/26/15 1414 (!) 162/115     Pulse Rate 11/26/15 1414 87     Resp 11/26/15 1414 18     Temp 11/26/15 1414 98.2 F (36.8 C)     Temp Source 11/26/15 1414 Oral     SpO2 11/26/15 1414 100 %     Weight 11/26/15 1415 175 lb (79.4 kg)     Height 11/26/15 1415 5\' 2"  (1.575 m)     Head Circumference --      Peak Flow --      Pain Score 11/26/15 1415 8     Pain Loc --  Pain Edu? --      Excl. in GC? --     General: Awake , Alert , and Oriented times 3; GCS 15 Head: Normal cephalic , atraumatic Eyes: Pupils equal , round, reactive to light Nose/Throat: No nasal drainage, patent upper airway without erythema or exudate.  Neck: Supple, Full range of motion, No anterior adenopathy or palpable thyroid masses Lungs: Clear to ascultation without wheezes , rhonchi, or rales Heart: Regular rate, regular rhythm without murmurs , gallops , or rubs Abdomen: Large amount of scar tissue from an old skin burned. Soft, non tender without rebound, guarding , or rigidity;  bowel sounds positive and symmetric and hypoactive in all 4 quadrants. No focal tenderness over McBurney's point. Negative Murphy's sign  Extremities: 2 plus symmetric pulses. No edema, clubbing or cyanosis Neurologic: normal ambulation, Motor symmetric without deficits, sensory intact Skin: warm, dry, no rashes   Labs:   All laboratory work was reviewed including any pertinent negatives or positives listed below:  Labs Reviewed  LIPASE, BLOOD  COMPREHENSIVE METABOLIC PANEL  CBC  URINALYSIS COMPLETEWITH MICROSCOPIC ([facility] ONLY)  Laboratory work was reviewed and showed no clinically significant abnormalities.    Radiology:  "Ct Renal Stone Study  Result Date: 11/26/2015 CLINICAL DATA:  Lower abdominal pain with nausea and vomiting for 3 days. EXAM: CT ABDOMEN AND PELVIS WITHOUT CONTRAST TECHNIQUE: Multidetector CT imaging of the abdomen and pelvis was performed following the standard protocol without IV contrast. COMPARISON:  None. FINDINGS: Lower chest: No acute findings. Coronary artery calcification noted. Hepatobiliary: No mass visualized on this unenhanced exam. Prior cholecystectomy noted. No evidence of biliary dilatation. Pancreas: No mass or inflammatory process visualized on this unenhanced exam. Spleen:  Within normal limits in size. Adrenals/Urinary tract: No evidence of urolithiasis or hydronephrosis. Unremarkable appearance of bladder. Stomach/Bowel: No evidence of obstruction, inflammatory process, or abnormal fluid collections. Moderate to large colonic stool burden noted. Vascular/Lymphatic: No pathologically enlarged lymph nodes identified. No evidence of abdominal aortic aneurysm. Aortic atherosclerosis. Reproductive: Prior hysterectomy noted. Adnexal regions are unremarkable in appearance. Other:  None. Musculoskeletal: No suspicious bone lesions identified. PLIF hardware at L4-5. IMPRESSION: No acute findings. Moderate to large stool burden noted; suggest clinical correlation  for possible constipation. Aortic and coronary artery atherosclerosis. Electronically Signed   By: Myles RosenthalJohn  Stahl M.D.   On: 11/26/2015 15:58  "  I personally reviewed the radiologic studies    ED Course:  Patient's stay here was uneventful and doesn't appear that she has any signs of a surgical abdomen such as bowel obstruction, bowel perforation, etc. Her discomfort is most likely secondary to constipation. His otherwise hemodynamically stable and again I don't suspect a life-threatening cause at this time. This may be an adverse effect from her new psychiatric medication but I hesitated to remove the medication and elected to continue outpatient treatment for the constipation. Clinical Course      Assessment:  Constipation   Final Clinical Impression:   Final diagnoses:  Abdominal pain in female     Plan: * Outpatient Patient was advised to return immediately if condition worsens. Patient was advised to follow up with their primary care physician or other specialized physicians involved in their outpatient care. The patient and/or family member/power of attorney had laboratory results reviewed at the bedside. All questions and concerns were addressed and appropriate discharge instructions were distributed by the nursing staff.             Jennye MoccasinBrian S Quigley, MD 11/26/15 [phone]

## 2015-11-29 ENCOUNTER — Emergency Department
Admission: EM | Admit: 2015-11-29 | Discharge: 2015-11-29 | Disposition: A | Discharge status: Home / Self Care | Payer: Medicare Other | Attending: Emergency Medicine | Admitting: Emergency Medicine

## 2015-11-29 DIAGNOSIS — R197 Diarrhea, unspecified: Secondary | ICD-10-CM | POA: Diagnosis not present

## 2015-11-29 DIAGNOSIS — I10 Essential (primary) hypertension: Secondary | ICD-10-CM | POA: Insufficient documentation

## 2015-11-29 DIAGNOSIS — R4182 Altered mental status, unspecified: Secondary | ICD-10-CM | POA: Insufficient documentation

## 2015-11-29 DIAGNOSIS — Z79899 Other long term (current) drug therapy: Secondary | ICD-10-CM | POA: Insufficient documentation

## 2015-11-29 DIAGNOSIS — Z794 Long term (current) use of insulin: Secondary | ICD-10-CM | POA: Diagnosis not present

## 2015-11-29 DIAGNOSIS — E119 Type 2 diabetes mellitus without complications: Secondary | ICD-10-CM | POA: Insufficient documentation

## 2015-11-29 LAB — COMPREHENSIVE METABOLIC PANEL
ALK PHOS: 111 U/L (ref 38–126)
ALT: 32 U/L (ref 14–54)
AST: 28 U/L (ref 15–41)
Albumin: 4.5 g/dL (ref 3.5–5.0)
Anion gap: 7 (ref 5–15)
BILIRUBIN TOTAL: 0.5 mg/dL (ref 0.3–1.2)
BUN: <5 mg/dL — ABNORMAL LOW (ref 6–20)
CALCIUM: 9.9 mg/dL (ref 8.9–10.3)
CO2: 28 mmol/L (ref 22–32)
CREATININE: 1.05 mg/dL — AB (ref 0.44–1.00)
Chloride: 103 mmol/L (ref 101–111)
Glucose, Bld: 105 mg/dL — ABNORMAL HIGH (ref 65–99)
Potassium: 3.8 mmol/L (ref 3.5–5.1)
Sodium: 138 mmol/L (ref 135–145)
Total Protein: 8.1 g/dL (ref 6.5–8.1)

## 2015-11-29 LAB — CBC
HCT: 38.2 % (ref 35.0–47.0)
Hemoglobin: 13.3 g/dL (ref 12.0–16.0)
MCH: 31.6 pg (ref 26.0–34.0)
MCHC: 34.9 g/dL (ref 32.0–36.0)
MCV: 90.7 fL (ref 80.0–100.0)
PLATELETS: 160 10*3/uL (ref 150–440)
RBC: 4.22 MIL/uL (ref 3.80–5.20)
RDW: 14.1 % (ref 11.5–14.5)
WBC: 7.1 10*3/uL (ref 3.6–11.0)

## 2015-11-29 LAB — GLUCOSE, CAPILLARY: GLUCOSE-CAPILLARY: 94 mg/dL (ref 65–99)

## 2015-11-29 NOTE — ED Provider Notes (Signed)
[facility] Emergency Department Provider Note  ____________________________________________  Time seen: Approximately 4:07 PM  I have reviewed the triage vital signs and the nursing notes.   HISTORY  Chief Complaint Altered Mental Status Level 5 caveat:  Portions of the history and physical were unable to be obtained due to the patient's baseline intellectual disability  Additional history obtained from caretaker bedside  HPI Kristina Foster is a 50 y.o. female brought to the ED by her caretaker due to diarrhea. She was seen in the emergency department 3 days ago with constipation. She had a CT scan and workup which was unremarkable. She's been using MiraLAX twice a day since then and also drank a bottle of magnesium citrate 3 days ago. After that, the patient has started having watery bowel movements. She complains of some vague generalized abdominal discomfort. No vomiting. Still eating and drinking although the caregiver is concerned that her appetite is somewhat decreased. No other changes. No fever or chills.     Past Medical History:  Diagnosis Date  . Anxiety   . Arthritis   . Constipation   . Depression   . Diabetes mellitus without complication (HCC)    Type 2   . Dizziness   . Hyperlipemia   . Hypertension   . Mental disability   . Schizo affective schizophrenia [facility])      Patient Active Problem List   Diagnosis Date Noted  . Lumbar spondylosis 08/02/2014     Past Surgical History:  Procedure Laterality Date  . ABDOMINAL HYSTERECTOMY       Prior to Admission medications   Medication Sig Start Date End Date Taking? Authorizing Provider  atorvastatin (LIPITOR) 10 MG tablet Take 10 mg by mouth daily.   Yes Historical Provider, MD  benztropine (COGENTIN) 2 MG tablet Take 2 mg by mouth 2 (two) times daily.   Yes Historical Provider, MD  colesevelam (WELCHOL) 625 MG tablet Take by mouth 3 (three) times daily with meals.    Yes  Historical Provider, MD  diazepam (VALIUM) 5 MG tablet Take 1 tablet (5 mg total) by mouth every 6 (six) hours as needed for muscle spasms. 08/05/14  Yes Lisbeth Renshaw, MD  furosemide (LASIX) 40 MG tablet Take 1 tablet by mouth daily. 11/08/15  Yes Historical Provider, MD  glipiZIDE (GLUCOTROL) 10 MG tablet Take 10 mg by mouth daily before breakfast.   Yes Historical Provider, MD  INGREZZA 40 MG CAPS Take 2 tablets by mouth daily. 11/09/15  Yes Historical Provider, MD  insulin glargine (LANTUS) 100 UNIT/ML injection Inject 35 Units into the skin at bedtime.    Yes Historical Provider, MD  lisinopril-hydrochlorothiazide (PRINZIDE,ZESTORETIC) 10-12.5 MG per tablet Take 1 tablet by mouth daily.   Yes Historical Provider, MD  magnesium hydroxide (MILK OF MAGNESIA) 400 MG/5ML suspension Take by mouth daily as needed for mild constipation.   Yes Historical Provider, MD  metoprolol succinate (TOPROL-XL) 100 MG 24 hr tablet Take 100 mg by mouth daily.   Yes Historical Provider, MD  NOVOLOG MIX 70/30 FLEXPEN (70-30) 100 UNIT/ML FlexPen Inject 35 Units into the skin daily with breakfast.  11/15/15  Yes Historical Provider, MD  polyethylene glycol (MIRALAX / GLYCOLAX) packet Take 17 g by mouth daily as needed.    Yes Historical Provider, MD  potassium chloride SA (K-DUR,KLOR-CON) 20 MEQ tablet Take 1 tablet by mouth daily. 11/08/15  Yes Historical Provider, MD  risperiDONE (RISPERDAL) 3 MG tablet Take 3 mg by mouth at bedtime.  Yes Historical Provider, MD  TRULICITY 0.75 MG/0.5ML SOPN Inject 0.5 mLs into the skin once a week. mondays 11/10/15  Yes Historical Provider, MD  venlafaxine (EFFEXOR) 75 MG tablet Take 75 mg by mouth 2 (two) times daily.   Yes Historical Provider, MD  oxyCODONE-acetaminophen (PERCOCET/ROXICET) 5-325 MG per tablet Take 1-2 tablets by mouth every 4 (four) hours as needed for moderate pain. Patient not taking: Reported on 11/29/2015 08/05/14   Lisbeth RenshawNeelesh Nundkumar, MD      Allergies Patient has no known allergies.   No family history on file.  Social History Social History  Substance Use Topics  . Smoking status: Never Smoker  . Smokeless tobacco: Never Used  . Alcohol use No    Review of Systems Limited ability to obtain due to intellectual disability. Constitutional:   No fever or chills.  Gastrointestinal:   Positive diarrhea    10-point ROS otherwise negative.  ____________________________________________   PHYSICAL EXAM:  VITAL SIGNS: ED Triage Vitals  Enc Vitals Group     BP 11/29/15 1339 (!) 173/108     Pulse Rate 11/29/15 1339 82     Resp 11/29/15 1339 16     Temp 11/29/15 1339 98.1 F (36.7 C)     Temp Source 11/29/15 1339 Oral     SpO2 11/29/15 1339 99 %     Weight 11/29/15 1340 175 lb (79.4 kg)     Height 11/29/15 1340 5\' 2"  (1.575 m)     Head Circumference --      Peak Flow --      Pain Score 11/29/15 1340 8     Pain Loc --      Pain Edu? --      Excl. in GC? --     Vital signs reviewed, nursing assessments reviewed.   Constitutional:   Alert and orientedTo self. Well appearing and in no distress. Eyes:   No scleral icterus. No conjunctival pallor. PERRL. EOMI.  No nystagmus. ENT   Head:   Normocephalic and atraumatic.   Nose:   No congestion/rhinnorhea. No septal hematoma   Mouth/Throat:   MMM, no pharyngeal erythema. No peritonsillar mass.    Neck:   No stridor. No SubQ emphysema. No meningismus. Hematological/Lymphatic/Immunilogical:   No cervical lymphadenopathy. Cardiovascular:   RRR. Symmetric bilateral radial and DP pulses.  No murmurs.  Respiratory:   Normal respiratory effort without tachypnea nor retractions. Breath sounds are clear and equal bilaterally. No wheezes/rales/rhonchi. Gastrointestinal:   Soft and nontender. Non distended. There is no CVA tenderness.  No rebound, rigidity, or guarding. Genitourinary:   deferred Musculoskeletal:   Nontender with normal range of motion in  all extremities. No joint effusions.  No lower extremity tenderness.  No edema. .  Skin:    Skin is warm, dry and intact. No rash noted.  No petechiae, purpura, or bullae.  ____________________________________________    LABS (pertinent positives/negatives) (all labs ordered are listed, but only abnormal results are displayed) Labs Reviewed  COMPREHENSIVE METABOLIC PANEL - Abnormal; Notable for the following:       Result Value   Glucose, Bld 105 (*)    BUN <5 (*)    Creatinine, Ser 1.05 (*)    All other components within normal limits  CBC  GLUCOSE, CAPILLARY  CBG MONITORING, ED   ____________________________________________   EKG    ____________________________________________    RADIOLOGY    ____________________________________________   PROCEDURES Procedures  ____________________________________________   INITIAL IMPRESSION / ASSESSMENT AND PLAN / ED COURSE  Pertinent labs & imaging results that were available during my care of the patient were reviewed by me and considered in my medical decision making (see chart for details).  Patient well appearing no acute distress. Tolerating oral intake. Appears to be having expected therapeutic effects of laxatives causing the diarrhea. Advised caregiver to stop giving laxatives and diarrhea will resolve within the next few days. Into any oral hydration and oral diet as tolerated. Follow-up with primary care. She had an appointment for tomorrow morning, the caregiver canceled it when patient arrived in the ED today. I advised her to try and schedule this appointment again for follow-up of the patient's symptoms.  Considering the patient's symptoms, medical history, and physical examination today, I have low suspicion for cholecystitis or biliary pathology, pancreatitis, perforation or bowel obstruction, hernia, intra-abdominal abscess, AAA or dissection, volvulus or intussusception, mesenteric ischemia, or  appendicitis.     Clinical Course    ____________________________________________   FINAL CLINICAL IMPRESSION(S) / ED DIAGNOSES  Final diagnoses:  Diarrhea, unspecified type       Portions of this note were generated with dragon dictation software. Dictation errors may occur despite best attempts at proofreading.    Sharman CheekPhillip Bowen Kia, MD 11/29/15 [phone]

## 2015-11-29 NOTE — ED Notes (Signed)
Pt's caregiver verbalized understanding of discharge instructions. NAD at this time. 

## 2015-11-29 NOTE — ED Triage Notes (Signed)
Pt here with care giver and is having shakes, loose stools since Saturday night, early Sunday with loss of appetite, states she is not acting herself, generalized weakness.

## 2015-12-04 ENCOUNTER — Other Ambulatory Visit: Payer: Self-pay | Admitting: Internal Medicine

## 2015-12-04 ENCOUNTER — Emergency Department: Payer: Medicare Other

## 2015-12-04 ENCOUNTER — Ambulatory Visit
Admission: RE | Admit: 2015-12-04 | Discharge: 2015-12-04 | Disposition: A | Discharge status: Home / Self Care | Payer: Medicare Other | Source: Ambulatory Visit | Attending: Internal Medicine | Admitting: Internal Medicine

## 2015-12-04 ENCOUNTER — Emergency Department
Admission: EM | Admit: 2015-12-04 | Discharge: 2015-12-04 | Disposition: A | Discharge status: Home / Self Care | Payer: Medicare Other | Attending: Emergency Medicine | Admitting: Emergency Medicine

## 2015-12-04 ENCOUNTER — Encounter: Payer: Self-pay | Admitting: Emergency Medicine

## 2015-12-04 DIAGNOSIS — R531 Weakness: Secondary | ICD-10-CM | POA: Insufficient documentation

## 2015-12-04 DIAGNOSIS — R251 Tremor, unspecified: Secondary | ICD-10-CM | POA: Diagnosis not present

## 2015-12-04 DIAGNOSIS — K5900 Constipation, unspecified: Secondary | ICD-10-CM

## 2015-12-04 DIAGNOSIS — I10 Essential (primary) hypertension: Secondary | ICD-10-CM | POA: Diagnosis not present

## 2015-12-04 DIAGNOSIS — K117 Disturbances of salivary secretion: Secondary | ICD-10-CM | POA: Insufficient documentation

## 2015-12-04 DIAGNOSIS — E119 Type 2 diabetes mellitus without complications: Secondary | ICD-10-CM | POA: Diagnosis not present

## 2015-12-04 DIAGNOSIS — Z794 Long term (current) use of insulin: Secondary | ICD-10-CM | POA: Insufficient documentation

## 2015-12-04 DIAGNOSIS — R109 Unspecified abdominal pain: Secondary | ICD-10-CM | POA: Insufficient documentation

## 2015-12-04 DIAGNOSIS — R1319 Other dysphagia: Secondary | ICD-10-CM | POA: Diagnosis not present

## 2015-12-04 DIAGNOSIS — Z7982 Long term (current) use of aspirin: Secondary | ICD-10-CM | POA: Diagnosis not present

## 2015-12-04 DIAGNOSIS — Z79899 Other long term (current) drug therapy: Secondary | ICD-10-CM | POA: Diagnosis not present

## 2015-12-04 LAB — CBC
HEMATOCRIT: 38.9 % (ref 35.0–47.0)
HEMOGLOBIN: 13.8 g/dL (ref 12.0–16.0)
MCH: 32 pg (ref 26.0–34.0)
MCHC: 35.5 g/dL (ref 32.0–36.0)
MCV: 90 fL (ref 80.0–100.0)
Platelets: 166 10*3/uL (ref 150–440)
RBC: 4.32 MIL/uL (ref 3.80–5.20)
RDW: 14 % (ref 11.5–14.5)
WBC: 7.9 10*3/uL (ref 3.6–11.0)

## 2015-12-04 LAB — COMPREHENSIVE METABOLIC PANEL
ALT: 27 U/L (ref 14–54)
AST: 24 U/L (ref 15–41)
Albumin: 4.4 g/dL (ref 3.5–5.0)
Alkaline Phosphatase: 107 U/L (ref 38–126)
Anion gap: 8 (ref 5–15)
BILIRUBIN TOTAL: 0.8 mg/dL (ref 0.3–1.2)
BUN: 5 mg/dL — AB (ref 6–20)
CO2: 30 mmol/L (ref 22–32)
Calcium: 10 mg/dL (ref 8.9–10.3)
Chloride: 96 mmol/L — ABNORMAL LOW (ref 101–111)
Creatinine, Ser: 1.13 mg/dL — ABNORMAL HIGH (ref 0.44–1.00)
GFR calc Af Amer: >60 mL/min (ref >60)
GFR, EST NON AFRICAN AMERICAN: 56 mL/min — AB (ref >60)
Glucose, Bld: 117 mg/dL — ABNORMAL HIGH (ref 65–99)
POTASSIUM: 4 mmol/L (ref 3.5–5.1)
Sodium: 134 mmol/L — ABNORMAL LOW (ref 135–145)
TOTAL PROTEIN: 8 g/dL (ref 6.5–8.1)

## 2015-12-04 LAB — DIFFERENTIAL
Basophils Absolute: 0 10*3/uL (ref 0–0.1)
Basophils Relative: 0 %
EOS ABS: 0.1 10*3/uL (ref 0–0.7)
EOS PCT: 2 %
LYMPHS ABS: 1.8 10*3/uL (ref 1.0–3.6)
Lymphocytes Relative: 23 %
MONO ABS: 0.8 10*3/uL (ref 0.2–0.9)
MONOS PCT: 10 %
Neutro Abs: 5.2 10*3/uL (ref 1.4–6.5)
Neutrophils Relative %: 65 %

## 2015-12-04 LAB — URINE DRUG SCREEN, QUALITATIVE (ARMC ONLY)
Amphetamines, Ur Screen: NOT DETECTED
BARBITURATES, UR SCREEN: NOT DETECTED
Benzodiazepine, Ur Scrn: NOT DETECTED
CANNABINOID 50 NG, UR ~~LOC~~: NOT DETECTED
Cocaine Metabolite,Ur ~~LOC~~: NOT DETECTED
MDMA (ECSTASY) UR SCREEN: NOT DETECTED
Methadone Scn, Ur: NOT DETECTED
Opiate, Ur Screen: NOT DETECTED
Phencyclidine (PCP) Ur S: NOT DETECTED
TRICYCLIC, UR SCREEN: NOT DETECTED

## 2015-12-04 LAB — PROTIME-INR
INR: 1.03
Prothrombin Time: 13.5 seconds (ref 11.4–15.2)

## 2015-12-04 LAB — GLUCOSE, CAPILLARY: GLUCOSE-CAPILLARY: 120 mg/dL — AB (ref 65–99)

## 2015-12-04 LAB — ETHANOL: Alcohol, Ethyl (B): <5 mg/dL (ref <5)

## 2015-12-04 LAB — APTT: aPTT: 26 seconds (ref 24–36)

## 2015-12-04 MED ORDER — LISINOPRIL 10 MG PO TABS
10.0000 mg | ORAL_TABLET | Freq: Once | ORAL | Status: AC
  Administered 2015-12-04: 10 mg via ORAL
  Filled 2015-12-04: qty 1

## 2015-12-04 MED ORDER — HYDROCHLOROTHIAZIDE 12.5 MG PO CAPS
12.5000 mg | ORAL_CAPSULE | Freq: Every day | ORAL | Status: DC
  Administered 2015-12-04: 12.5 mg via ORAL
  Filled 2015-12-04: qty 1

## 2015-12-04 MED ORDER — IOPAMIDOL (ISOVUE-300) INJECTION 61%: 30.0000 mL | Freq: Once | INTRAVENOUS | Status: DC | PRN

## 2015-12-04 MED ORDER — IOPAMIDOL (ISOVUE-300) INJECTION 61%
100.0000 mL | Freq: Once | INTRAVENOUS | Status: AC | PRN
  Administered 2015-12-04: 100 mL via INTRAVENOUS

## 2015-12-04 NOTE — ED Triage Notes (Signed)
Pt did have large bm after Mag Citrate last week, only hard small stool since then.

## 2015-12-04 NOTE — ED Notes (Signed)
LKW 2100 12/03/15. Caregiver states she noticed pt drooling upon waking.

## 2015-12-04 NOTE — Progress Notes (Signed)
CH responded to a code stroke page to visit the Pt in ED Rm 07. [facility] team was evaluating the Pt in the room. CH waited outside to the medical team to complete their evaluation, then went in and talked to the Pt to find out if she had a family member with her waiting outside. Pt told Ch, she had a legal aide waiting in the lobby. CH went out to the lobby, found the legal aide and brought her in to the Pt's room. CH had conversation with the Pt, who requested for advanced directives. CH provided the brochure and education on advanced directives, and asked the Pt and [facility] legal aide to [facility]. CH also provided prayers an   12/04/15 1200  Clinical Encounter Type  Visited With Patient;Health care provider  Visit Type Initial;Spiritual support;ED;Trauma  Referral From Nurse  Consult/Referral To Chaplain  Spiritual Encounters  Spiritual Needs Prayer;Other (Comment)  Stress Factors  Patient Stress Factors Other (Comment)  Family Stress Factors Other (Comment)  d presence.

## 2015-12-04 NOTE — ED Notes (Signed)
Alert, oriented, MAE, speech clear, sitter at bedside. Successful swallow screen. Apple sauce taken.

## 2015-12-04 NOTE — ED Triage Notes (Signed)
Was seen here 2 times in the past week for mid abdomen pain, caregiver states she was diagnosed with constipation and sent home. Today, pain continues, pt has been unable to eat or sleep for a week now, caregiver states "I am not taking her home like this." Pt states she is weak, and having right hand tremors which is new for pt. Grips strong bilaterally, face symmetrical. {t states she is having a hard time swallowing, no breathing difficulty noted.

## 2015-12-04 NOTE — ED Notes (Signed)
Alert and oriented, speech clear, per caregiver at her baseline again.

## 2015-12-04 NOTE — ED Notes (Signed)
Called [phone] to initiate code stroke 1022

## 2015-12-04 NOTE — Consult Note (Signed)
Referring Physician: Scotty Court    Chief Complaint: Drooling and difficulty swallowing  HPI: Kristina Foster is an 50 y.o. female who went to bed at baseline last evening.  Today patient was noted to be drooling.  Patient described difficulty swallowing.  Some tremor was noted in the LUE as well.  Patient was brought in for evaluation.  Code stroke was called.  Initial NIHSS of 3.      Date last known well: Date: 12/03/2015 Time last known well: Time: 21:00 tPA Given: No: Outside time window  Past Medical History:  Diagnosis Date  . Anxiety   . Arthritis   . Constipation   . Depression   . Diabetes mellitus without complication (HCC)    Type 2   . Dizziness   . Hyperlipemia   . Hypertension   . Mental disability   . Schizo affective schizophrenia [facility])     Past Surgical History:  Procedure Laterality Date  . ABDOMINAL HYSTERECTOMY      Family history:  DM AND HTN.  Patient unable to tell me family members.    Social History:  reports that she has never smoked. She has never used smokeless tobacco. She reports that she does not drink alcohol or use drugs.  Allergies: No Known Allergies  Medications: I have reviewed the patient's current medications. Prior to Admission:  Prior to Admission medications   Medication Sig Start Date End Date Taking? Authorizing Provider  aspirin EC 81 MG tablet Take 81 mg by mouth daily.   Yes Historical Provider, MD  atorvastatin (LIPITOR) 10 MG tablet Take 10 mg by mouth daily.   Yes Historical Provider, MD  benztropine (COGENTIN) 2 MG tablet Take 2 mg by mouth 2 (two) times daily.   Yes Historical Provider, MD  colesevelam (WELCHOL) 625 MG tablet Take by mouth 3 (three) times daily with meals.    Yes Historical Provider, MD  furosemide (LASIX) 40 MG tablet Take 1 tablet by mouth daily. 11/08/15  Yes Historical Provider, MD  INGREZZA 40 MG CAPS Take 2 tablets by mouth daily. 11/09/15  Yes Historical Provider, MD  insulin glargine (LANTUS) 100  UNIT/ML injection Inject 35 Units into the skin at bedtime.    Yes Historical Provider, MD  lisinopril-hydrochlorothiazide (PRINZIDE,ZESTORETIC) 10-12.5 MG per tablet Take 1 tablet by mouth daily.   Yes Historical Provider, MD  metoprolol succinate (TOPROL-XL) 100 MG 24 hr tablet Take 100 mg by mouth daily.   Yes Historical Provider, MD  NOVOLOG MIX 70/30 FLEXPEN (70-30) 100 UNIT/ML FlexPen Inject 35 Units into the skin daily with breakfast.  11/15/15  Yes Historical Provider, MD  potassium chloride SA (K-DUR,KLOR-CON) 20 MEQ tablet Take 1 tablet by mouth daily. 11/08/15  Yes Historical Provider, MD  risperiDONE (RISPERDAL) 3 MG tablet Take 3 mg by mouth at bedtime.    Yes Historical Provider, MD  TRULICITY 0.75 MG/0.5ML SOPN Inject 0.5 mLs into the skin once a week. mondays 11/10/15  Yes Historical Provider, MD  venlafaxine (EFFEXOR) 75 MG tablet Take 75 mg by mouth 2 (two) times daily.   Yes Historical Provider, MD    ROS: History obtained from the patient  General ROS: negative for - chills, fatigue, fever, night sweats, weight gain or weight loss Psychological ROS: negative for - behavioral disorder, hallucinations, memory difficulties, mood swings or suicidal ideation Ophthalmic ROS: negative for - blurry vision, double vision, eye pain or loss of vision ENT ROS: as noted in HPI Allergy and Immunology ROS: negative for -  hives or itchy/watery eyes Hematological and Lymphatic ROS: negative for - bleeding problems, bruising or swollen lymph nodes Endocrine ROS: negative for - galactorrhea, hair pattern changes, polydipsia/polyuria or temperature intolerance Respiratory ROS: negative for - cough, hemoptysis, shortness of breath or wheezing Cardiovascular ROS: negative for - chest pain, dyspnea on exertion, edema or irregular heartbeat Gastrointestinal ROS: negative for - abdominal pain, diarrhea, hematemesis, nausea/vomiting or stool incontinence Genito-Urinary ROS: negative for - dysuria,  hematuria, incontinence or urinary frequency/urgency Musculoskeletal ROS: negative for - joint swelling or muscular weakness Neurological ROS: as noted in HPI Dermatological ROS: negative for rash and skin lesion changes  Physical Examination: Blood pressure (!) 147/110, pulse 90, temperature 97.8 F (36.6 C), temperature source Oral, resp. rate 20, height 5\' 2"  (1.575 m), weight 79.8 kg (176 lb), SpO2 99 %.  HEENT-  Normocephalic, no lesions, without obvious abnormality.  Normal external eye and conjunctiva.  Normal TM's bilaterally.  Normal auditory canals and external ears. Normal external nose, mucus membranes and septum.  Normal pharynx. Cardiovascular- S1, S2 normal, pulses palpable throughout   Lungs- chest clear, no wheezing, rales, normal symmetric air entry Abdomen- soft, non-tender; bowel sounds normal; no masses,  no organomegaly Extremities- no edema Lymph-no adenopathy palpable Musculoskeletal-no joint tenderness, deformity or swelling Skin-warm and dry, no hyperpigmentation, vitiligo, or suspicious lesions  Neurological Examination Mental Status: Alert.  Unable to give me her correct age.  Did not know the month.  Speech fluent without evidence of aphasia.  Some mild dysarthria.  Able to follow 3 step commands without difficulty. Cranial Nerves: II: Discs flat bilaterally; Visual fields grossly normal, pupils equal, round, reactive to light and accommodation III,IV, VI: ptosis not present, extra-ocular motions intact bilaterally V,VII: smile symmetric, facial light touch sensation normal bilaterally VIII: hearing normal bilaterally IX,X: gag reflex present XI: bilateral shoulder shrug XII: midline tongue extension Motor: Right : Upper extremity   5/5    Left:     Upper extremity   5/5  Lower extremity   5/5     Lower extremity   5/5 Increased tone throughout Sensory: Pinprick and light touch intact throughout, bilaterally Deep Tendon Reflexes: 2+ and symmetric  throughout Plantars: Right: downgoing   Left: upgoing Cerebellar: Normal finger-to-nose and normal heel-to-shin testing bilaterally Gait: not tested due to safety concerns   Laboratory Studies:  Basic Metabolic Panel:  Recent Labs Lab 11/29/15 1342  NA 138  K 3.8  CL 103  CO2 28  GLUCOSE 105*  BUN <5*  CREATININE 1.05*  CALCIUM 9.9    Liver Function Tests:  Recent Labs Lab 11/29/15 1342  AST 28  ALT 32  ALKPHOS 111  BILITOT 0.5  PROT 8.1  ALBUMIN 4.5   No results for input(s): LIPASE, AMYLASE in the last 168 hours. No results for input(s): AMMONIA in the last 168 hours.  CBC:  Recent Labs Lab 11/29/15 1342  WBC 7.1  HGB 13.3  HCT 38.2  MCV 90.7  PLT 160    Cardiac Enzymes: No results for input(s): CKTOTAL, CKMB, CKMBINDEX, TROPONINI in the last 168 hours.  BNP: Invalid input(s): POCBNP  CBG:  Recent Labs Lab 11/29/15 1512 12/04/15 1025  GLUCAP 94 120*    Microbiology: Results for orders placed or performed during the hospital encounter of 07/29/14  Surgical pcr screen     Status: None   Collection Time: 07/29/14 10:09 AM  Result Value Ref Range Status   MRSA, PCR NEGATIVE NEGATIVE Final   Staphylococcus aureus NEGATIVE NEGATIVE Final  Comment:        The Xpert SA Assay (FDA approved for NASAL specimens in patients over 50 years of age), is one component of a comprehensive surveillance program.  Test performance has been validated by [facility] for patients greater than or equal to 50 year old. It is not intended to diagnose infection nor to guide or monitor treatment.     Coagulation Studies: No results for input(s): LABPROT, INR in the last 72 hours.  Urinalysis: No results for input(s): COLORURINE, LABSPEC, PHURINE, GLUCOSEU, HGBUR, BILIRUBINUR, KETONESUR, PROTEINUR, UROBILINOGEN, NITRITE, LEUKOCYTESUR in the last 168 hours.  Invalid input(s): APPERANCEUR  Lipid Panel: No results found for: CHOL, TRIG, HDL, CHOLHDL,  VLDL, LDLCALC  HgbA1C:  Lab Results  Component Value Date   HGBA1C 6.9 (H) 08/02/2014    Urine Drug Screen:  No results found for: LABOPIA, COCAINSCRNUR, LABBENZ, AMPHETMU, THCU, LABBARB  Alcohol Level: No results for input(s): ETH in the last 168 hours.   Imaging: Ct Head Wo Contrast  Result Date: 12/04/2015 CLINICAL DATA:  Slight vascular calcification in the carotid siphon regions bilaterally. EXAM: CT HEAD WITHOUT CONTRAST TECHNIQUE: Contiguous axial images were obtained from the base of the skull through the vertex without intravenous contrast. COMPARISON:  None. FINDINGS: Brain: The ventricles are normal in size and configuration. There is no intracranial mass, hemorrhage, extra-axial fluid collection, or midline shift. There is slight small vessel disease in the right centrum semiovale bilaterally. Elsewhere gray-white compartments appear normal. No acute infarct is demonstrable on this study. Vascular: There is no evident hyperdense vessel. There is slight calcification in each carotid siphon region. Skull: The bony calvarium appears intact. Sinuses/Orbits: Visualized paranasal sinuses are clear. Visualized orbits appear symmetric bilaterally. Other: The mastoid air cells are clear. IMPRESSION: Slight right centrum semiovale small vessel disease. No intracranial mass, hemorrhage, or extra-axial fluid collection. No acute infarct is demonstrable on this study. Critical Value/emergent results were called by telephone at the time of interpretation on 12/04/2015 at 10:40 am to Dr. Dorothea GlassmanPAUL MALINDA , who verbally acknowledged these results. Electronically Signed   By: Bretta BangWilliam  Woodruff III M.D.   On: 12/04/2015 10:41   Dg Abd 2 Views  Result Date: 12/04/2015 CLINICAL DATA:  Lower abdominal pain for 2 days.  Constipation. EXAM: ABDOMEN - 2 VIEW COMPARISON:  04/27/2014 FINDINGS: There is a non obstructive bowel gas pattern. No supine evidence of free air. No organomegaly or suspicious  calcification. No significant increase in stool burden. No acute bony abnormality. Postsurgical changes in the lower lumbar spine. IMPRESSION: Negative. Electronically Signed   By: Charlett NoseKevin  Dover M.D.   On: 12/04/2015 09:49    Assessment: 50 y.o. female presenting with difficulty swallowing, drooling and left arm tremor.  Tremor no longer present.  Patient not drooling as well at this time and having no difficulty controlling her secretions.  Head CT personally reviewed and shows no acute changes.  Do not suspect acute infarct at this time although with multiple stoke risk factors.  Patient on ASA at home.    Stroke Risk Factors - diabetes mellitus, hyperlipidemia and hypertension  Plan: 1. No further imaging indicated at this time.   2. Continue ASA daily.    Case discussed with Dr. Scotty CourtStafford.     Thana FarrLeslie Kidus Delman, MD Neurology [phone] 12/04/2015, 11:59 AM

## 2015-12-06 NOTE — ED Provider Notes (Signed)
[facility] Emergency Department Provider Note   THIS IS A LATE-ENTRY NOTE ____________________________________________  Time seen:12-04-15 approx 10:30am  I have reviewed the triage vital signs and the nursing notes.   HISTORY  Chief Complaint Abdominal Pain    HPI Kristina Foster is a 50 y.o. female brought to the ED by her caregiver due to drooling last night. Caregiver reports the patient has had low energy for about a week now however since her recent evaluations in the ED for constipation and then diarrhea. No falls. She just concerned that the patient normally helps her around the house and now has not been able to help with any of the chores.No headache or vomiting. She reports the patient hasn't been able to take her medications today because she can't swallow.     Past Medical History:  Diagnosis Date  . Anxiety   . Arthritis   . Constipation   . Depression   . Diabetes mellitus without complication (HCC)    Type 2   . Dizziness   . Hyperlipemia   . Hypertension   . Mental disability   . Schizo affective schizophrenia [facility](HCC)      Patient Active Problem List   Diagnosis Date Noted  . Lumbar spondylosis 08/02/2014     Past Surgical History:  Procedure Laterality Date  . ABDOMINAL HYSTERECTOMY       Prior to Admission medications   Medication Sig Start Date End Date Taking? Authorizing Provider  aspirin EC 81 MG tablet Take 81 mg by mouth daily.   Yes Historical Provider, MD  atorvastatin (LIPITOR) 10 MG tablet Take 10 mg by mouth daily.   Yes Historical Provider, MD  benztropine (COGENTIN) 2 MG tablet Take 2 mg by mouth 2 (two) times daily.   Yes Historical Provider, MD  colesevelam (WELCHOL) 625 MG tablet Take by mouth 3 (three) times daily with meals.    Yes Historical Provider, MD  furosemide (LASIX) 40 MG tablet Take 1 tablet by mouth daily. 11/08/15  Yes Historical Provider, MD  INGREZZA 40 MG CAPS Take 2 tablets by mouth  daily. 11/09/15  Yes Historical Provider, MD  insulin glargine (LANTUS) 100 UNIT/ML injection Inject 35 Units into the skin at bedtime.    Yes Historical Provider, MD  lisinopril-hydrochlorothiazide (PRINZIDE,ZESTORETIC) 10-12.5 MG per tablet Take 1 tablet by mouth daily.   Yes Historical Provider, MD  metoprolol succinate (TOPROL-XL) 100 MG 24 hr tablet Take 100 mg by mouth daily.   Yes Historical Provider, MD  NOVOLOG MIX 70/30 FLEXPEN (70-30) 100 UNIT/ML FlexPen Inject 35 Units into the skin daily with breakfast.  11/15/15  Yes Historical Provider, MD  potassium chloride SA (K-DUR,KLOR-CON) 20 MEQ tablet Take 1 tablet by mouth daily. 11/08/15  Yes Historical Provider, MD  risperiDONE (RISPERDAL) 3 MG tablet Take 3 mg by mouth at bedtime.    Yes Historical Provider, MD  TRULICITY 0.75 MG/0.5ML SOPN Inject 0.5 mLs into the skin once a week. mondays 11/10/15  Yes Historical Provider, MD  venlafaxine (EFFEXOR) 75 MG tablet Take 75 mg by mouth 2 (two) times daily.   Yes Historical Provider, MD     Allergies Patient has no known allergies.   No family history on file.  Social History Social History  Substance Use Topics  . Smoking status: Never Smoker  . Smokeless tobacco: Never Used  . Alcohol use No    Review of Systems  Constitutional:   No fever or chills.  ENT:   No  sore throat. No rhinorrhea. Cardiovascular:   No chest pain. Respiratory:   No dyspnea or cough. Gastrointestinal:   Negative for abdominal pain, vomiting and diarrhea.  Genitourinary:   Negative for dysuria or difficulty urinating. Musculoskeletal:   Negative for focal pain or swelling Neurological:   Negative for headaches 10-point ROS otherwise negative.  ____________________________________________   PHYSICAL EXAM:  VITAL SIGNS: ED Triage Vitals  Enc Vitals Group     BP 12/04/15 1007 (!) 156/106     Pulse Rate 12/04/15 1007 88     Resp 12/04/15 1007 18     Temp 12/04/15 1007 97.8 F (36.6 C)      Temp Source 12/04/15 1007 Oral     SpO2 12/04/15 1007 100 %     Weight 12/04/15 1008 176 lb (79.8 kg)     Height 12/04/15 1008 5\' 2"  (1.575 m)     Head Circumference --      Peak Flow --      Pain Score 12/04/15 1008 8     Pain Loc --      Pain Edu? --      Excl. in GC? --     Vital signs reviewed, nursing assessments reviewed.   Constitutional:   Alert and oriented. Well appearing and in no distress. Eyes:   No scleral icterus. No conjunctival pallor. PERRL. EOMI.  No nystagmus. ENT   Head:   Normocephalic and atraumatic.   Nose:   No congestion/rhinnorhea. No septal hematoma   Mouth/Throat:   MMM, no pharyngeal erythema. No peritonsillar mass. Managing secretions. No drooling. No choking or coughing when lying flat on her back with her mouth closed.    Neck:   No stridor. No SubQ emphysema. No meningismus. Hematological/Lymphatic/Immunilogical:   No cervical lymphadenopathy. Cardiovascular:   RRR. Symmetric bilateral radial and DP pulses.  No murmurs.  Respiratory:   Normal respiratory effort without tachypnea nor retractions. Breath sounds are clear and equal bilaterally. No wheezes/rales/rhonchi. Gastrointestinal:   Soft and nontender. Non distended. There is no CVA tenderness.  No rebound, rigidity, or guarding. Genitourinary:   deferred Musculoskeletal:   Nontender with normal range of motion in all extremities. No joint effusions.  No lower extremity tenderness.  No edema. Neurologic:   Baseline speech and language.  No pronator drift Normal finger to nose. CN 2-10 normal. Motor grossly intact. No gross focal neurologic deficits are appreciated.  Skin:    Skin is warm, dry and intact. No rash noted.  No petechiae, purpura, or bullae.  ____________________________________________    LABS (pertinent positives/negatives) (all labs ordered are listed, but only abnormal results are displayed) Labs Reviewed  GLUCOSE, CAPILLARY - Abnormal; Notable for the  following:       Result Value   Glucose-Capillary 120 (*)    All other components within normal limits  COMPREHENSIVE METABOLIC PANEL - Abnormal; Notable for the following:    Sodium 134 (*)    Chloride 96 (*)    Glucose, Bld 117 (*)    BUN 5 (*)    Creatinine, Ser 1.13 (*)    GFR calc non Af Amer 56 (*)    All other components within normal limits  ETHANOL  PROTIME-INR  APTT  CBC  DIFFERENTIAL  URINE DRUG SCREEN, QUALITATIVE ([facility] ONLY)   ____________________________________________   EKG    ____________________________________________    RADIOLOGY  CT head unremarkable MRI brain unremarkable CT abdomen pelvis unremarkable  ____________________________________________   PROCEDURES Procedures  ____________________________________________   INITIAL IMPRESSION /  ASSESSMENT AND PLAN / ED COURSE  Pertinent labs & imaging results that were available during my care of the patient were reviewed by me and considered in my medical decision making (see chart for details).  Patient well appearing no acute distress. Code stroke was initiated immediately upon assessment in triage. Case discussed with neurology infection recommended no further imaging or workup and did not think this was a stroke or anything neurologic of concern. However, with the caregivers persistent concerns about the ability to take care of the patient home in thinking that the patient is unable swallow, we completed an MRI brain CT abdomen and pelvis and bedside swallow evaluation. All these were unremarkable. The patient was able to take apple sauce and take medication. Discharge the patient home to follow up with primary care.Considering the patient's symptoms, medical history, and physical examination today, I have low suspicion for ischemic stroke, intracranial hemorrhage, meningitis, encephalitis, carotid or vertebral dissection, venous sinus thrombosis, MS, intracranial hypertension, glaucoma, CRAO,  CRVO, or temporal arteritis.      Clinical Course    ____________________________________________   FINAL CLINICAL IMPRESSION(S) / ED DIAGNOSES  Final diagnoses:  Drooling  Generalized weakness       Portions of this note were generated with dragon dictation software. Dictation errors may occur despite best attempts at proofreading.    Sharman CheekPhillip Mei Suits, MD 12/06/15 [phone]

## 2015-12-07 ENCOUNTER — Encounter: Payer: Self-pay | Admitting: Emergency Medicine

## 2015-12-07 ENCOUNTER — Other Ambulatory Visit: Payer: Self-pay

## 2015-12-07 ENCOUNTER — Observation Stay
Admission: EM | Admit: 2015-12-07 | Discharge: 2015-12-10 | Disposition: A | Discharge status: Home / Self Care | Payer: Medicare Other | Attending: Family Medicine | Admitting: Family Medicine

## 2015-12-07 ENCOUNTER — Ambulatory Visit
Admission: RE | Admit: 2015-12-07 | Discharge: 2015-12-07 | Disposition: A | Discharge status: Home / Self Care | Payer: Medicare Other | Source: Ambulatory Visit | Attending: Internal Medicine | Admitting: Internal Medicine

## 2015-12-07 DIAGNOSIS — F419 Anxiety disorder, unspecified: Secondary | ICD-10-CM | POA: Insufficient documentation

## 2015-12-07 DIAGNOSIS — R269 Unspecified abnormalities of gait and mobility: Secondary | ICD-10-CM | POA: Diagnosis not present

## 2015-12-07 DIAGNOSIS — Z90710 Acquired absence of both cervix and uterus: Secondary | ICD-10-CM | POA: Diagnosis not present

## 2015-12-07 DIAGNOSIS — G249 Dystonia, unspecified: Secondary | ICD-10-CM | POA: Insufficient documentation

## 2015-12-07 DIAGNOSIS — I2510 Atherosclerotic heart disease of native coronary artery without angina pectoris: Secondary | ICD-10-CM | POA: Diagnosis not present

## 2015-12-07 DIAGNOSIS — Z7982 Long term (current) use of aspirin: Secondary | ICD-10-CM | POA: Diagnosis not present

## 2015-12-07 DIAGNOSIS — F79 Unspecified intellectual disabilities: Secondary | ICD-10-CM | POA: Diagnosis not present

## 2015-12-07 DIAGNOSIS — R600 Localized edema: Secondary | ICD-10-CM | POA: Insufficient documentation

## 2015-12-07 DIAGNOSIS — I10 Essential (primary) hypertension: Secondary | ICD-10-CM | POA: Insufficient documentation

## 2015-12-07 DIAGNOSIS — R531 Weakness: Secondary | ICD-10-CM

## 2015-12-07 DIAGNOSIS — R42 Dizziness and giddiness: Secondary | ICD-10-CM | POA: Diagnosis present

## 2015-12-07 DIAGNOSIS — E119 Type 2 diabetes mellitus without complications: Secondary | ICD-10-CM | POA: Diagnosis not present

## 2015-12-07 DIAGNOSIS — R2681 Unsteadiness on feet: Secondary | ICD-10-CM

## 2015-12-07 DIAGNOSIS — F329 Major depressive disorder, single episode, unspecified: Secondary | ICD-10-CM | POA: Insufficient documentation

## 2015-12-07 DIAGNOSIS — Z1231 Encounter for screening mammogram for malignant neoplasm of breast: Secondary | ICD-10-CM | POA: Insufficient documentation

## 2015-12-07 DIAGNOSIS — Z794 Long term (current) use of insulin: Secondary | ICD-10-CM | POA: Diagnosis not present

## 2015-12-07 DIAGNOSIS — F259 Schizoaffective disorder, unspecified: Secondary | ICD-10-CM | POA: Insufficient documentation

## 2015-12-07 DIAGNOSIS — R262 Difficulty in walking, not elsewhere classified: Secondary | ICD-10-CM

## 2015-12-07 DIAGNOSIS — I700 Atherosclerosis of aorta: Secondary | ICD-10-CM | POA: Insufficient documentation

## 2015-12-07 DIAGNOSIS — Z9889 Other specified postprocedural states: Secondary | ICD-10-CM | POA: Diagnosis not present

## 2015-12-07 DIAGNOSIS — E782 Mixed hyperlipidemia: Secondary | ICD-10-CM | POA: Diagnosis not present

## 2015-12-07 DIAGNOSIS — R55 Syncope and collapse: Secondary | ICD-10-CM | POA: Diagnosis not present

## 2015-12-07 DIAGNOSIS — M1990 Unspecified osteoarthritis, unspecified site: Secondary | ICD-10-CM | POA: Diagnosis not present

## 2015-12-07 NOTE — ED Triage Notes (Signed)
Per Caregiver: Pt. Started new psych med about 1 month ago, about a week ago began having increased confusion and dizziness, spells where "she just stares off and doesn't even answer when I call her". Caregiver reports psychiatrist told her to stop meds because these are known side effects, this was 2 days ago. Pt. Caregiver said sx continuing and didn't know what to do but bring to ER.

## 2015-12-07 NOTE — ED Triage Notes (Signed)
Pt presents to ED by EMS from her family care home with c/o weakness and near syncope. Caregiver states symptoms are likely due to recent medication change.

## 2015-12-08 ENCOUNTER — Observation Stay
Admit: 2015-12-08 | Discharge: 2015-12-08 | Disposition: A | Discharge status: Home / Self Care | Payer: Medicare Other | Attending: Internal Medicine | Admitting: Internal Medicine

## 2015-12-08 ENCOUNTER — Emergency Department: Payer: Medicare Other

## 2015-12-08 DIAGNOSIS — R55 Syncope and collapse: Secondary | ICD-10-CM | POA: Diagnosis present

## 2015-12-08 LAB — CBC WITH DIFFERENTIAL/PLATELET
Basophils Absolute: 0 10*3/uL (ref 0–0.1)
Basophils Relative: 0 %
Eosinophils Absolute: 0.1 10*3/uL (ref 0–0.7)
Eosinophils Relative: 2 %
HEMATOCRIT: 36.1 % (ref 35.0–47.0)
Hemoglobin: 12.7 g/dL (ref 12.0–16.0)
LYMPHS PCT: 24 %
Lymphs Abs: 1.7 10*3/uL (ref 1.0–3.6)
MCH: 31.7 pg (ref 26.0–34.0)
MCHC: 35.2 g/dL (ref 32.0–36.0)
MCV: 89.9 fL (ref 80.0–100.0)
MONO ABS: 0.7 10*3/uL (ref 0.2–0.9)
MONOS PCT: 10 %
NEUTROS ABS: 4.6 10*3/uL (ref 1.4–6.5)
Neutrophils Relative %: 64 %
Platelets: 143 10*3/uL — ABNORMAL LOW (ref 150–440)
RBC: 4.02 MIL/uL (ref 3.80–5.20)
RDW: 13.6 % (ref 11.5–14.5)
WBC: 7.2 10*3/uL (ref 3.6–11.0)

## 2015-12-08 LAB — GLUCOSE, CAPILLARY
Glucose-Capillary: 175 mg/dL — ABNORMAL HIGH (ref 65–99)
Glucose-Capillary: 131 mg/dL — ABNORMAL HIGH (ref 65–99)
Glucose-Capillary: 188 mg/dL — ABNORMAL HIGH (ref 65–99)
Glucose-Capillary: 140 mg/dL — ABNORMAL HIGH (ref 65–99)

## 2015-12-08 LAB — URINE DRUG SCREEN, QUALITATIVE (ARMC ONLY)
AMPHETAMINES, UR SCREEN: NOT DETECTED
Barbiturates, Ur Screen: NOT DETECTED
Benzodiazepine, Ur Scrn: NOT DETECTED
Cannabinoid 50 Ng, Ur ~~LOC~~: NOT DETECTED
Cocaine Metabolite,Ur ~~LOC~~: NOT DETECTED
MDMA (ECSTASY) UR SCREEN: NOT DETECTED
Methadone Scn, Ur: NOT DETECTED
Opiate, Ur Screen: NOT DETECTED
PHENCYCLIDINE (PCP) UR S: NOT DETECTED
Tricyclic, Ur Screen: NOT DETECTED

## 2015-12-08 LAB — URINALYSIS COMPLETE WITH MICROSCOPIC (ARMC ONLY)
BILIRUBIN URINE: NEGATIVE
Glucose, UA: NEGATIVE mg/dL
Hgb urine dipstick: NEGATIVE
KETONES UR: NEGATIVE mg/dL
Leukocytes, UA: NEGATIVE
Nitrite: NEGATIVE
PROTEIN: NEGATIVE mg/dL
RBC / HPF: NONE SEEN RBC/hpf (ref 0–5)
Specific Gravity, Urine: 1.001 — ABNORMAL LOW (ref 1.005–1.030)
pH: 6 (ref 5.0–8.0)

## 2015-12-08 LAB — COMPREHENSIVE METABOLIC PANEL
ALT: 29 U/L (ref 14–54)
ANION GAP: 6 (ref 5–15)
AST: 35 U/L (ref 15–41)
Albumin: 4.2 g/dL (ref 3.5–5.0)
Alkaline Phosphatase: 103 U/L (ref 38–126)
BILIRUBIN TOTAL: 0.9 mg/dL (ref 0.3–1.2)
BUN: 8 mg/dL (ref 6–20)
CO2: 30 mmol/L (ref 22–32)
Calcium: 9.4 mg/dL (ref 8.9–10.3)
Chloride: 95 mmol/L — ABNORMAL LOW (ref 101–111)
Creatinine, Ser: 1.15 mg/dL — ABNORMAL HIGH (ref 0.44–1.00)
GFR, EST NON AFRICAN AMERICAN: 55 mL/min — AB (ref >60)
Glucose, Bld: 148 mg/dL — ABNORMAL HIGH (ref 65–99)
POTASSIUM: 3.8 mmol/L (ref 3.5–5.1)
Sodium: 131 mmol/L — ABNORMAL LOW (ref 135–145)
TOTAL PROTEIN: 7.5 g/dL (ref 6.5–8.1)

## 2015-12-08 LAB — MRSA PCR SCREENING: MRSA by PCR: NEGATIVE

## 2015-12-08 LAB — ACETAMINOPHEN LEVEL: Acetaminophen (Tylenol), Serum: <10 ug/mL — ABNORMAL LOW (ref 10–30)

## 2015-12-08 LAB — TSH: TSH: 1.371 u[IU]/mL (ref 0.350–4.500)

## 2015-12-08 LAB — SALICYLATE LEVEL

## 2015-12-08 LAB — TROPONIN I

## 2015-12-08 LAB — ETHANOL

## 2015-12-08 MED ORDER — GLUCERNA SHAKE PO LIQD
237.0000 mL | Freq: Three times a day (TID) | ORAL | Status: DC
  Administered 2015-12-09 – 2015-12-10 (×4): 237 mL via ORAL

## 2015-12-08 MED ORDER — DIPHENHYDRAMINE HCL 50 MG/ML IJ SOLN
INTRAMUSCULAR | Status: AC
  Filled 2015-12-08: qty 1

## 2015-12-08 MED ORDER — RISPERIDONE 1 MG PO TABS
3.0000 mg | ORAL_TABLET | Freq: Every day | ORAL | Status: DC
  Administered 2015-12-08 – 2015-12-09 (×2): 3 mg via ORAL
  Filled 2015-12-08 (×2): qty 3

## 2015-12-08 MED ORDER — FUROSEMIDE 40 MG PO TABS
40.0000 mg | ORAL_TABLET | Freq: Every day | ORAL | Status: DC
  Administered 2015-12-08 – 2015-12-10 (×2): 40 mg via ORAL
  Filled 2015-12-08 (×3): qty 1

## 2015-12-08 MED ORDER — LISINOPRIL-HYDROCHLOROTHIAZIDE 10-12.5 MG PO TABS
1.0000 | ORAL_TABLET | Freq: Every day | ORAL | Status: DC
Start: 2015-12-08 — End: 2015-12-08

## 2015-12-08 MED ORDER — ATORVASTATIN CALCIUM 10 MG PO TABS
10.0000 mg | ORAL_TABLET | Freq: Every day | ORAL | Status: DC
  Administered 2015-12-08 – 2015-12-10 (×3): 10 mg via ORAL
  Filled 2015-12-08 (×3): qty 1

## 2015-12-08 MED ORDER — INSULIN ASPART 100 UNIT/ML ~~LOC~~ SOLN
0.0000 [IU] | Freq: Three times a day (TID) | SUBCUTANEOUS | Status: DC
  Administered 2015-12-08: 3 [IU] via SUBCUTANEOUS
  Administered 2015-12-08: 2 [IU] via SUBCUTANEOUS
  Administered 2015-12-08: 3 [IU] via SUBCUTANEOUS
  Administered 2015-12-09 (×2): 2 [IU] via SUBCUTANEOUS
  Administered 2015-12-09: 5 [IU] via SUBCUTANEOUS
  Administered 2015-12-10 (×2): 2 [IU] via SUBCUTANEOUS
  Filled 2015-12-08: qty 2
  Filled 2015-12-08: qty 3
  Filled 2015-12-08 (×3): qty 2

## 2015-12-08 MED ORDER — ONDANSETRON HCL 4 MG PO TABS: 4.0000 mg | ORAL_TABLET | Freq: Four times a day (QID) | ORAL | Status: DC | PRN

## 2015-12-08 MED ORDER — METOPROLOL SUCCINATE ER 100 MG PO TB24
100.0000 mg | ORAL_TABLET | Freq: Every day | ORAL | Status: DC
  Administered 2015-12-08 – 2015-12-10 (×2): 100 mg via ORAL
  Filled 2015-12-08 (×3): qty 1

## 2015-12-08 MED ORDER — HYDROCHLOROTHIAZIDE 12.5 MG PO CAPS
12.5000 mg | ORAL_CAPSULE | Freq: Every day | ORAL | Status: DC
  Administered 2015-12-08 – 2015-12-10 (×2): 12.5 mg via ORAL
  Filled 2015-12-08 (×3): qty 1

## 2015-12-08 MED ORDER — ENOXAPARIN SODIUM 40 MG/0.4ML ~~LOC~~ SOLN
40.0000 mg | SUBCUTANEOUS | Status: DC
  Administered 2015-12-08 – 2015-12-09 (×2): 40 mg via SUBCUTANEOUS
  Filled 2015-12-08 (×2): qty 0.4

## 2015-12-08 MED ORDER — ACETAMINOPHEN 650 MG RE SUPP: 650.0000 mg | Freq: Four times a day (QID) | RECTAL | Status: DC | PRN

## 2015-12-08 MED ORDER — BENZTROPINE MESYLATE 1 MG PO TABS
2.0000 mg | ORAL_TABLET | Freq: Two times a day (BID) | ORAL | Status: DC
  Administered 2015-12-08 – 2015-12-10 (×5): 2 mg via ORAL
  Filled 2015-12-08 (×6): qty 2

## 2015-12-08 MED ORDER — SODIUM CHLORIDE 0.9 % IV SOLN
INTRAVENOUS | Status: DC
  Administered 2015-12-08: 125 mL/h via INTRAVENOUS
  Administered 2015-12-08 – 2015-12-10 (×4): via INTRAVENOUS

## 2015-12-08 MED ORDER — ASPIRIN EC 81 MG PO TBEC
81.0000 mg | DELAYED_RELEASE_TABLET | Freq: Every day | ORAL | Status: DC
  Administered 2015-12-08 – 2015-12-10 (×3): 81 mg via ORAL
  Filled 2015-12-08 (×3): qty 1

## 2015-12-08 MED ORDER — DOCUSATE SODIUM 100 MG PO CAPS
100.0000 mg | ORAL_CAPSULE | Freq: Two times a day (BID) | ORAL | Status: DC
  Administered 2015-12-08 – 2015-12-09 (×3): 100 mg via ORAL
  Filled 2015-12-08 (×3): qty 1

## 2015-12-08 MED ORDER — ACETAMINOPHEN 325 MG PO TABS
650.0000 mg | ORAL_TABLET | Freq: Four times a day (QID) | ORAL | Status: DC | PRN
Start: 2015-12-08 — End: 2015-12-10

## 2015-12-08 MED ORDER — POTASSIUM CHLORIDE CRYS ER 20 MEQ PO TBCR
20.0000 meq | EXTENDED_RELEASE_TABLET | Freq: Every day | ORAL | Status: DC
  Administered 2015-12-08 – 2015-12-10 (×3): 20 meq via ORAL
  Filled 2015-12-08 (×3): qty 1

## 2015-12-08 MED ORDER — INSULIN GLARGINE 100 UNIT/ML ~~LOC~~ SOLN
24.0000 [IU] | Freq: Every day | SUBCUTANEOUS | Status: DC
  Administered 2015-12-08 – 2015-12-09 (×2): 24 [IU] via SUBCUTANEOUS
  Filled 2015-12-08 (×3): qty 0.24

## 2015-12-08 MED ORDER — INSULIN ASPART 100 UNIT/ML ~~LOC~~ SOLN
0.0000 [IU] | Freq: Every day | SUBCUTANEOUS | Status: DC
  Filled 2015-12-08: qty 2
  Filled 2015-12-08: qty 5

## 2015-12-08 MED ORDER — ONDANSETRON HCL 4 MG/2ML IJ SOLN: 4.0000 mg | Freq: Four times a day (QID) | INTRAMUSCULAR | Status: DC | PRN

## 2015-12-08 MED ORDER — LISINOPRIL 10 MG PO TABS
10.0000 mg | ORAL_TABLET | Freq: Every day | ORAL | Status: DC
  Administered 2015-12-08 – 2015-12-10 (×2): 10 mg via ORAL
  Filled 2015-12-08 (×3): qty 1

## 2015-12-08 MED ORDER — VENLAFAXINE HCL 37.5 MG PO TABS
75.0000 mg | ORAL_TABLET | Freq: Two times a day (BID) | ORAL | Status: DC
  Administered 2015-12-08 – 2015-12-10 (×5): 75 mg via ORAL
  Filled 2015-12-08 (×5): qty 2

## 2015-12-08 MED ORDER — DIPHENHYDRAMINE HCL 50 MG/ML IJ SOLN
12.5000 mg | Freq: Once | INTRAMUSCULAR | Status: AC
  Administered 2015-12-08: 12.5 mg via INTRAVENOUS

## 2015-12-08 MED ORDER — SODIUM CHLORIDE 0.9% FLUSH
3.0000 mL | Freq: Two times a day (BID) | INTRAVENOUS | Status: DC
  Administered 2015-12-08 – 2015-12-09 (×3): 3 mL via INTRAVENOUS

## 2015-12-08 MED ORDER — INSULIN ASPART PROT & ASPART (70-30 MIX) 100 UNIT/ML PEN: 35.0000 [IU] | PEN_INJECTOR | Freq: Every day | SUBCUTANEOUS | Status: DC

## 2015-12-08 MED ORDER — COLESEVELAM HCL 625 MG PO TABS
625.0000 mg | ORAL_TABLET | Freq: Three times a day (TID) | ORAL | Status: DC
  Administered 2015-12-08 – 2015-12-10 (×8): 625 mg via ORAL
  Filled 2015-12-08 (×9): qty 1

## 2015-12-08 NOTE — H&P (Addendum)
Kristina Foster is an 50 y.o. female.   Chief Complaint: Fall HPI: The patient with past medical history of tardive dyskinesia presents to the emergency department after a fall. Her caregiver states that she lost consciousness but the patient states that she was weak and tripped. Over the last few weeks she has felt weak and her movement has been slowed. She had been prescribed a medicine called Ingrezza for tardive dyskinesia but this was recently discontinued by her primary care doctor as her symptoms appeared to be getting worse. The patient denies hitting her head. She denies chest pain or shortness of breath associated with her fall. Head CT negative for acute process. EKG is unchanged. Physical exam is unremarkable, but as the patient may not be the best historian due to her mental illness emergency department staff was concerned she may need further evaluation and call the hospitalist service for admission.  Past Medical History:  Diagnosis Date  . Anxiety   . Arthritis   . Constipation   . Depression   . Diabetes mellitus without complication (HCC)    Type 2   . Dizziness   . Hyperlipemia   . Hypertension   . Mental disability   . Schizo affective schizophrenia [facility])     Past Surgical History:  Procedure Laterality Date  . ABDOMINAL HYSTERECTOMY    . BACK SURGERY      Family History  Problem Relation Age of Onset  . Breast cancer Neg Hx    Social History:  reports that she has never smoked. She has never used smokeless tobacco. She reports that she does not drink alcohol or use drugs.  Allergies: No Known Allergies  Prior to Admission medications   Medication Sig Start Date End Date Taking? Authorizing Provider  aspirin EC 81 MG tablet Take 81 mg by mouth daily.    Historical Provider, MD  atorvastatin (LIPITOR) 10 MG tablet Take 10 mg by mouth daily.    Historical Provider, MD  benztropine (COGENTIN) 2 MG tablet Take 2 mg by mouth 2 (two) times daily.    Historical  Provider, MD  colesevelam (WELCHOL) 625 MG tablet Take by mouth 3 (three) times daily with meals.     Historical Provider, MD  furosemide (LASIX) 40 MG tablet Take 1 tablet by mouth daily. 11/08/15   Historical Provider, MD  INGREZZA 40 MG CAPS Take 2 tablets by mouth daily. 11/09/15   Historical Provider, MD  insulin glargine (LANTUS) 100 UNIT/ML injection Inject 35 Units into the skin at bedtime.     Historical Provider, MD  lisinopril-hydrochlorothiazide (PRINZIDE,ZESTORETIC) 10-12.5 MG per tablet Take 1 tablet by mouth daily.    Historical Provider, MD  metoprolol succinate (TOPROL-XL) 100 MG 24 hr tablet Take 100 mg by mouth daily.    Historical Provider, MD  NOVOLOG MIX 70/30 FLEXPEN (70-30) 100 UNIT/ML FlexPen Inject 35 Units into the skin daily with breakfast.  11/15/15   Historical Provider, MD  potassium chloride SA (K-DUR,KLOR-CON) 20 MEQ tablet Take 1 tablet by mouth daily. 11/08/15   Historical Provider, MD  risperiDONE (RISPERDAL) 3 MG tablet Take 3 mg by mouth at bedtime.     Historical Provider, MD  TRULICITY 2.94 TM/5.4YT SOPN Inject 0.5 mLs into the skin once a week. mondays 11/10/15   Historical Provider, MD  venlafaxine (EFFEXOR) 75 MG tablet Take 75 mg by mouth 2 (two) times daily.    Historical Provider, MD     Results for orders placed or performed during the  hospital encounter of 12/07/15 (from the past 48 hour(s))  CBC with Differential     Status: Abnormal   Collection Time: 12/07/15 11:36 PM  Result Value Ref Range   WBC 7.2 3.6 - 11.0 K/uL   RBC 4.02 3.80 - 5.20 MIL/uL   Hemoglobin 12.7 12.0 - 16.0 g/dL   HCT 36.1 35.0 - 47.0 %   MCV 89.9 80.0 - 100.0 fL   MCH 31.7 26.0 - 34.0 pg   MCHC 35.2 32.0 - 36.0 g/dL   RDW 13.6 11.5 - 14.5 %   Platelets 143 (L) 150 - 440 K/uL   Neutrophils Relative % 64 %   Neutro Abs 4.6 1.4 - 6.5 K/uL   Lymphocytes Relative 24 %   Lymphs Abs 1.7 1.0 - 3.6 K/uL   Monocytes Relative 10 %   Monocytes Absolute 0.7 0.2 - 0.9 K/uL    Eosinophils Relative 2 %   Eosinophils Absolute 0.1 0 - 0.7 K/uL   Basophils Relative 0 %   Basophils Absolute 0.0 0 - 0.1 K/uL  Comprehensive metabolic panel     Status: Abnormal   Collection Time: 12/07/15 11:36 PM  Result Value Ref Range   Sodium 131 (L) 135 - 145 mmol/L   Potassium 3.8 3.5 - 5.1 mmol/L   Chloride 95 (L) 101 - 111 mmol/L   CO2 30 22 - 32 mmol/L   Glucose, Bld 148 (H) 65 - 99 mg/dL   BUN 8 6 - 20 mg/dL   Creatinine, Ser 1.15 (H) 0.44 - 1.00 mg/dL   Calcium 9.4 8.9 - 10.3 mg/dL   Total Protein 7.5 6.5 - 8.1 g/dL   Albumin 4.2 3.5 - 5.0 g/dL   AST 35 15 - 41 U/L   ALT 29 14 - 54 U/L   Alkaline Phosphatase 103 38 - 126 U/L   Total Bilirubin 0.9 0.3 - 1.2 mg/dL   GFR calc non Af Amer 55 (L) >60 mL/min   GFR calc Af Amer >60 >60 mL/min    Comment: (NOTE) The eGFR has been calculated using the CKD EPI equation. This calculation has not been validated in all clinical situations. eGFR's persistently <60 mL/min signify possible Chronic Kidney Disease.    Anion gap 6 5 - 15  Ethanol     Status: None   Collection Time: 12/07/15 11:36 PM  Result Value Ref Range   Alcohol, Ethyl (B) <5 <5 mg/dL    Comment:        LOWEST DETECTABLE LIMIT FOR SERUM ALCOHOL IS 5 mg/dL FOR MEDICAL PURPOSES ONLY   Acetaminophen level     Status: Abnormal   Collection Time: 12/07/15 11:36 PM  Result Value Ref Range   Acetaminophen (Tylenol), Serum <10 (L) 10 - 30 ug/mL    Comment:        THERAPEUTIC CONCENTRATIONS VARY SIGNIFICANTLY. A RANGE OF 10-30 ug/mL MAY BE AN EFFECTIVE CONCENTRATION FOR MANY PATIENTS. HOWEVER, SOME ARE BEST TREATED AT CONCENTRATIONS OUTSIDE THIS RANGE. ACETAMINOPHEN CONCENTRATIONS >150 ug/mL AT 4 HOURS AFTER INGESTION AND >50 ug/mL AT 12 HOURS AFTER INGESTION ARE OFTEN ASSOCIATED WITH TOXIC REACTIONS.   Salicylate level     Status: None   Collection Time: 12/07/15 11:36 PM  Result Value Ref Range   Salicylate Lvl <6.0 2.8 - 30.0 mg/dL  Troponin I      Status: None   Collection Time: 12/07/15 11:36 PM  Result Value Ref Range   Troponin I <0.03 <0.03 ng/mL  Urinalysis complete, with microscopic ([place] only)  Status: Abnormal   Collection Time: 12/07/15 11:36 PM  Result Value Ref Range   Color, Urine STRAW (A) YELLOW   APPearance CLEAR (A) CLEAR   Glucose, UA NEGATIVE NEGATIVE mg/dL   Bilirubin Urine NEGATIVE NEGATIVE   Ketones, ur NEGATIVE NEGATIVE mg/dL   Specific Gravity, Urine 1.001 (L) 1.005 - 1.030   Hgb urine dipstick NEGATIVE NEGATIVE   pH 6.0 5.0 - 8.0   Protein, ur NEGATIVE NEGATIVE mg/dL   Nitrite NEGATIVE NEGATIVE   Leukocytes, UA NEGATIVE NEGATIVE   RBC / HPF NONE SEEN 0 - 5 RBC/hpf   WBC, UA 0-5 0 - 5 WBC/hpf   Bacteria, UA RARE (A) NONE SEEN   Squamous Epithelial / LPF 0-5 (A) NONE SEEN  Urine Drug Screen, Qualitative ([facility] only)     Status: None   Collection Time: 12/07/15 11:36 PM  Result Value Ref Range   Tricyclic, Ur Screen NONE DETECTED NONE DETECTED   Amphetamines, Ur Screen NONE DETECTED NONE DETECTED   MDMA (Ecstasy)Ur Screen NONE DETECTED NONE DETECTED   Cocaine Metabolite,Ur [place] NONE DETECTED NONE DETECTED   Opiate, Ur Screen NONE DETECTED NONE DETECTED   Phencyclidine (PCP) Ur S NONE DETECTED NONE DETECTED   Cannabinoid 50 Ng, Ur  NONE DETECTED NONE DETECTED   Barbiturates, Ur Screen NONE DETECTED NONE DETECTED   Benzodiazepine, Ur Scrn NONE DETECTED NONE DETECTED   Methadone Scn, Ur NONE DETECTED NONE DETECTED    Comment: (NOTE) 785  Tricyclics, urine               Cutoff 1000 ng/mL 200  Amphetamines, urine             Cutoff 1000 ng/mL 300  MDMA (Ecstasy), urine           Cutoff 500 ng/mL 400  Cocaine Metabolite, urine       Cutoff 300 ng/mL 500  Opiate, urine                   Cutoff 300 ng/mL 600  Phencyclidine (PCP), urine      Cutoff 25 ng/mL 700  Cannabinoid, urine              Cutoff 50 ng/mL 800  Barbiturates, urine             Cutoff 200 ng/mL 900  Benzodiazepine, urine            Cutoff 200 ng/mL 1000 Methadone, urine                Cutoff 300 ng/mL 1100 1200 The urine drug screen provides only a preliminary, unconfirmed 1300 analytical test result and should not be used for non-medical 1400 purposes. Clinical consideration and professional judgment should 1500 be applied to any positive drug screen result due to possible 1600 interfering substances. A more specific alternate chemical method 1700 must be used in order to obtain a confirmed analytical result.  1800 Gas chromato graphy / mass spectrometry (GC/MS) is the preferred 1900 confirmatory method.    Ct Head Wo Contrast  Result Date: 12/08/2015 CLINICAL DATA:  Acute onset of confusion and dizziness. Generalized weakness and near syncope. Initial encounter. EXAM: CT HEAD WITHOUT CONTRAST TECHNIQUE: Contiguous axial images were obtained from the base of the skull through the vertex without intravenous contrast. COMPARISON:  CT of the head and MRI of the brain performed 12/04/2015 FINDINGS: Brain: No evidence of acute infarction, hemorrhage, hydrocephalus, extra-axial collection or mass lesion/mass effect. The posterior fossa, including the cerebellum,  brainstem and fourth ventricle, is within normal limits. The third and lateral ventricles, and basal ganglia are unremarkable in appearance. The cerebral hemispheres are symmetric in appearance, with normal gray-white differentiation. No mass effect or midline shift is seen. Vascular: No hyperdense vessel or unexpected calcification. Skull: There is no evidence of fracture; visualized osseous structures are unremarkable in appearance. Stabbed orbits Sinuses/Orbits: The visualized portions of the orbits are within normal limits. The paranasal sinuses and mastoid air cells are well-aerated. Other: No significant soft tissue abnormalities are seen. IMPRESSION: Unremarkable noncontrast CT of the head. Electronically Signed   By: Garald Balding M.D.   On: 12/08/2015 01:10   Dg  Chest Port 1 View  Result Date: 12/08/2015 CLINICAL DATA:  Acute onset of confusion and dizziness. Initial encounter. EXAM: PORTABLE CHEST 1 VIEW COMPARISON:  Chest radiograph performed 07/12/2006 FINDINGS: The lungs are mildly hypoexpanded. Mild bibasilar opacities likely reflect atelectasis. There is no evidence of pleural effusion or pneumothorax. The cardiomediastinal silhouette is borderline normal in size. No acute osseous abnormalities are seen. IMPRESSION: Lungs mildly hypoexpanded. Mild bibasilar opacities likely reflect atelectasis. Electronically Signed   By: Garald Balding M.D.   On: 12/08/2015 01:11   Mm Screening Breast Tomo Bilateral  Result Date: 12/07/2015 CLINICAL DATA:  Screening. EXAM: 2D DIGITAL SCREENING BILATERAL MAMMOGRAM WITH CAD AND ADJUNCT TOMO COMPARISON:  Previous exam(s). ACR Breast Density Category c: The breast tissue is heterogeneously dense, which may obscure small masses. FINDINGS: There are no findings suspicious for malignancy. Images were processed with CAD. IMPRESSION: No mammographic evidence of malignancy. A result letter of this screening mammogram will be mailed directly to the patient. RECOMMENDATION: Screening mammogram in one year. (Code:SM-B-01Y) BI-RADS CATEGORY  1: Negative. Electronically Signed   By: Everlean Alstrom M.D.   On: 12/07/2015 09:47    Review of Systems  Constitutional: Negative for chills and fever.  HENT: Negative for sore throat and tinnitus.   Eyes: Negative for blurred vision and redness.  Respiratory: Negative for cough and shortness of breath.   Cardiovascular: Negative for chest pain, palpitations, orthopnea and PND.  Gastrointestinal: Negative for abdominal pain, diarrhea, nausea and vomiting.  Genitourinary: Negative for dysuria, frequency and urgency.  Musculoskeletal: Negative for joint pain and myalgias.  Skin: Negative for rash.       No lesions  Neurological: Negative for speech change, focal weakness and weakness.   Endo/Heme/Allergies: Does not bruise/bleed easily.       No temperature intolerance  Psychiatric/Behavioral: Negative for depression and suicidal ideas.    Blood pressure 95/64, pulse 87, temperature 98 F (36.7 C), temperature source Oral, resp. rate 15, height _0  (1.575 m), weight 79.8 kg (176 lb), SpO2 99 %. Physical Exam  Vitals reviewed. Constitutional: She is oriented to person, place, and time. She appears well-developed and well-nourished. No distress.  HENT:  Head: Normocephalic and atraumatic.  Mouth/Throat: Oropharynx is clear and moist.  Eyes: Conjunctivae and EOM are normal. Pupils are equal, round, and reactive to light.  Neck: Normal range of motion. Neck supple. No JVD present. No tracheal deviation present. No thyromegaly present.  Cardiovascular: Normal rate, regular rhythm and normal heart sounds.  Exam reveals no gallop and no friction rub.   No murmur heard. Respiratory: Effort normal and breath sounds normal.  GI: Soft. Bowel sounds are normal. She exhibits no distension. There is no tenderness.  Genitourinary:  Genitourinary Comments: Deferred  Musculoskeletal: Normal range of motion. She exhibits no edema.  Lymphadenopathy:    She  has no cervical adenopathy.  Neurological: She is alert and oriented to person, place, and time. No cranial nerve deficit. She exhibits normal muscle tone.  Skin: Skin is warm and dry. No rash noted. No erythema.  Psychiatric: She has a normal mood and affect. Her behavior is normal. Judgment and thought content normal.     Assessment/Plan This is a 50 year old female admitted for fall and near syncope. 1. Fall: Reportedly mechanical however the patient may have had a near syncopal episode. I think that the etiology of her fall was a combination of slowed movement as well as weakness or dizziness. I have ordered an echocardiogram and cardiology consult. The patient denies chest pain and she has no murmurs on physical exam. EKG  shows an old right bundle branch block but no tachycardia. Most notably, the patient admits that she has been feeling weak for some time now, thus I do not believe this is cardiac in origin. 2. Tardive dyskinesia: The patient carries this diagnosis but she has a catatonic-like appearance that may be more consistent with a dystonic reaction. This may have contributed to her fall. I have given her Benadryl. Continue Cogentin per home regimen. 3. Hypertension: Controlled, perhaps too well. Check orthostatic vital signs. The patient's blood pressure is mildly low. We will hydrate with intravenous saline as dehydration may have contributed to weakness and/or falling. Continue metoprolol and lisinopril. The patient is also on Lasix although I cannot determine the reason for this. I will adjust her medication regimen says that she receives her diuretic as needed. 4. Diabetes mellitus type 2: Hold mixed insulin. Adjust basal insulin and provide sliding scale insulin while hospitalized. 5. Hyperlipidemia: Continue WelChol 6. Schizophrenia: Hold Risperdal for now. May restart in 24-48 hours to observe for improvement in dystonia. 7. DVT prophylaxis: Lovenox 8. GI prophylaxis: None The patient is a full code. Time spent on admission orders and patient care approximately 45 minutes  Harrie Foreman, MD 12/08/2015, 3:01 AM

## 2015-12-08 NOTE — Progress Notes (Signed)
Patient ID: Willaim BanePatricia A Salls, female   DOB: 03/20/1965, 50 y.o.   MRN: [id number]   [facility] [place] [facility]   PATIENT NAME: Nemiah Commanderatricia Hackenberg    MR#:  [id number]  DATE OF BIRTH:  07/04/1965  SUBJECTIVE:  CHIEF COMPLAINT:   Chief Complaint  Patient presents with  . Weakness  . Near Syncope  Patient seen at bedside. Chart reviewed and care assumed.  REVIEW OF SYSTEMS:  ROS Constitutional: Negative for chills and fever.  HENT: Negative for sore throat and tinnitus.   Eyes: Negative for blurred vision and redness.  Respiratory: Negative for cough and shortness of breath.   Cardiovascular: Negative for chest pain, palpitations, orthopnea and PND.  Gastrointestinal: Negative for abdominal pain, diarrhea, nausea and vomiting.  Genitourinary: Negative for dysuria, frequency and urgency.  Musculoskeletal: Negative for joint pain and myalgias.  Skin: Negative for rash.       No lesions  Neurological: Negative for speech change, focal weakness and weakness.  Endo/Heme/Allergies: Does not bruise/bleed easily.       No temperature intolerance  Psychiatric/Behavioral: Negative for depression and suicidal ideas.  DRUG ALLERGIES:  No Known Allergies VITALS:  Blood pressure 120/74, pulse 88, temperature 98.4 F (36.9 C), temperature source Oral, resp. rate 18, height 5\' 2"  (1.575 m), weight 77.5 kg (170 lb 14.4 oz), SpO2 100 %. PHYSICAL EXAMINATION:  Physical Exam  Vitals reviewed. Constitutional: She is oriented to person, place, and time. She appears well-developed and well-nourished. No distress.  HENT:  Head: Normocephalic and atraumatic.  Mouth/Throat: Oropharynx is clear and moist.  Eyes: Conjunctivae and EOM are normal. Pupils are equal, round, and reactive to light.  Neck: Normal range of motion. Neck supple. No JVD present. No tracheal deviation present. No thyromegaly present.  Cardiovascular: Normal rate, regular rhythm and normal heart sounds.  Exam  reveals no gallop and no friction rub.   No murmur heard. Respiratory: Effort normal and breath sounds normal.  GI: Soft. Bowel sounds are normal. She exhibits no distension. There is no tenderness.  Musculoskeletal: Normal range of motion. She exhibits no edema.  Lymphadenopathy:    She has no cervical adenopathy.  Neurological: She is alert and oriented to person, place, and time. No cranial nerve deficit. She exhibits normal muscle tone.  Skin: Skin is warm and dry. No rash noted. No erythema.  Psychiatric: She has a normal mood and affect. Her behavior is normal. Judgment and thought content normal.  LABORATORY PANEL:   CBC  Recent Labs Lab 12/07/15 2336  WBC 7.2  HGB 12.7  HCT 36.1  PLT 143*   ------------------------------------------------------------------------------------------------------------------ Chemistries   Recent Labs Lab 12/07/15 2336  NA 131*  K 3.8  CL 95*  CO2 30  GLUCOSE 148*  BUN 8  CREATININE 1.15*  CALCIUM 9.4  AST 35  ALT 29  ALKPHOS 103  BILITOT 0.9   RADIOLOGY:  Ct Head Wo Contrast  Result Date: 12/08/2015 CLINICAL DATA:  Acute onset of confusion and dizziness. Generalized weakness and near syncope. Initial encounter. EXAM: CT HEAD WITHOUT CONTRAST TECHNIQUE: Contiguous axial images were obtained from the base of the skull through the vertex without intravenous contrast. COMPARISON:  CT of the head and MRI of the brain performed 12/04/2015 FINDINGS: Brain: No evidence of acute infarction, hemorrhage, hydrocephalus, extra-axial collection or mass lesion/mass effect. The posterior fossa, including the cerebellum, brainstem and fourth ventricle, is within normal limits. The third and lateral ventricles, and basal ganglia are unremarkable in appearance.  The cerebral hemispheres are symmetric in appearance, with normal gray-white differentiation. No mass effect or midline shift is seen. Vascular: No hyperdense vessel or unexpected calcification.  Skull: There is no evidence of fracture; visualized osseous structures are unremarkable in appearance. Stabbed orbits Sinuses/Orbits: The visualized portions of the orbits are within normal limits. The paranasal sinuses and mastoid air cells are well-aerated. Other: No significant soft tissue abnormalities are seen. IMPRESSION: Unremarkable noncontrast CT of the head. Electronically Signed   By: Roanna RaiderJeffery  Chang M.D.   On: 12/08/2015 01:10   Dg Chest Port 1 View  Result Date: 12/08/2015 CLINICAL DATA:  Acute onset of confusion and dizziness. Initial encounter. EXAM: PORTABLE CHEST 1 VIEW COMPARISON:  Chest radiograph performed 07/12/2006 FINDINGS: The lungs are mildly hypoexpanded. Mild bibasilar opacities likely reflect atelectasis. There is no evidence of pleural effusion or pneumothorax. The cardiomediastinal silhouette is borderline normal in size. No acute osseous abnormalities are seen. IMPRESSION: Lungs mildly hypoexpanded. Mild bibasilar opacities likely reflect atelectasis. Electronically Signed   By: Roanna RaiderJeffery  Chang M.D.   On: 12/08/2015 01:11   ASSESSMENT AND PLAN:   This is a 50 year old female admitted for fall and near syncope. 1. Fall: Mechanical versus near syncopal secondary to weakness and dizziness Cardiology consultation is pending. Echo was done and results of which are pending. 2. Tardive dyskinesia: Continue Cogentin per home regimen. 3. Hypertension: Controlled, perhaps too well. Check orthostatic vital signs.  Continue metoprolol and lisinopril.  4. Diabetes mellitus type 2: Hold mixed insulin. Adjust basal insulin and provide sliding scale insulin while hospitalized. 5. Hyperlipidemia: Continue WelChol 6. Schizophrenia: Hold Risperdal for now. May restart in 24-48 hours to observe for improvement in dystonia. 7. DVT prophylaxis: Lovenox 8. GI prophylaxis: None  All the records are reviewed and case discussed with Care Management/Social Worker. Management plans discussed  with the patient, family and they are in agreement.  CODE STATUS: Full  TOTAL TIME TAKING CARE OF THIS PATIENT: 30 minutes.   More than 50% of the time was spent in counseling/coordination of care: YES  POSSIBLE D/C IN 2-3 DAYS, DEPENDING ON CLINICAL CONDITION.   Ridhaan Dreibelbis D.O. on 12/08/2015 at 3:52 PM  Between 7am to 6pm - Pager - [phone]  After 6pm go to [email or web address] - Social research officer, [email or web address] [email or web address]  [facility] [place] [facility]  Office  [phone]  CC: Primary care physician; Derwood KaplanEason,  Ernest B, MD  Note: This dictation was prepared with Dragon dictation along with smaller phrase technology. Any transcriptional errors that result from this process are unintentional.

## 2015-12-08 NOTE — ED Notes (Signed)
MD Sung at bedside. 

## 2015-12-08 NOTE — ED Provider Notes (Signed)
[facility] Emergency Department Provider Note   ____________________________________________   First MD Initiated Contact with Patient 12/08/15 0012     (approximate)  I have reviewed the triage vital signs and the nursing notes.   HISTORY  Chief Complaint Weakness and Near Syncope  History obtained by caregiver  HPI Kristina Foster is a 50 y.o. female brought to the ED from family care home with a chief complaint of generalized weakness, dizziness and near syncope. Caregiver reports patient having increased confusion and dizziness with gait instability over the past week. This evening she was unable to stand secondary to the above symptoms. She was found by the commode and states she nearly passed out. Normally patient is ambulatory, conversant and works. Caregiver reports patient started a new psychiatric medication (Ingrezza) for tardive dyskinesia at the end of September.Her PCP took her off 2 days ago thinking it contributed to her symptoms. Patient reports symptoms persists. Denies recent fever, chills, chest pain, shortness of breath, abdominal pain, vomiting, diarrhea. Does report nausea. Denies recent travel or trauma. Nothing makes her symptoms better or worse.   Past Medical History:  Diagnosis Date  . Anxiety   . Arthritis   . Constipation   . Depression   . Diabetes mellitus without complication (HCC)    Type 2   . Dizziness   . Hyperlipemia   . Hypertension   . Mental disability   . Schizo affective schizophrenia [facility](HCC)     Patient Active Problem List   Diagnosis Date Noted  . Lumbar spondylosis 08/02/2014    Past Surgical History:  Procedure Laterality Date  . ABDOMINAL HYSTERECTOMY    . BACK SURGERY      Prior to Admission medications   Medication Sig Start Date End Date Taking? Authorizing Provider  aspirin EC 81 MG tablet Take 81 mg by mouth daily.    Historical Provider, MD  atorvastatin (LIPITOR) 10 MG tablet Take 10  mg by mouth daily.    Historical Provider, MD  benztropine (COGENTIN) 2 MG tablet Take 2 mg by mouth 2 (two) times daily.    Historical Provider, MD  colesevelam (WELCHOL) 625 MG tablet Take by mouth 3 (three) times daily with meals.     Historical Provider, MD  furosemide (LASIX) 40 MG tablet Take 1 tablet by mouth daily. 11/08/15   Historical Provider, MD  INGREZZA 40 MG CAPS Take 2 tablets by mouth daily. 11/09/15   Historical Provider, MD  insulin glargine (LANTUS) 100 UNIT/ML injection Inject 35 Units into the skin at bedtime.     Historical Provider, MD  lisinopril-hydrochlorothiazide (PRINZIDE,ZESTORETIC) 10-12.5 MG per tablet Take 1 tablet by mouth daily.    Historical Provider, MD  metoprolol succinate (TOPROL-XL) 100 MG 24 hr tablet Take 100 mg by mouth daily.    Historical Provider, MD  NOVOLOG MIX 70/30 FLEXPEN (70-30) 100 UNIT/ML FlexPen Inject 35 Units into the skin daily with breakfast.  11/15/15   Historical Provider, MD  potassium chloride SA (K-DUR,KLOR-CON) 20 MEQ tablet Take 1 tablet by mouth daily. 11/08/15   Historical Provider, MD  risperiDONE (RISPERDAL) 3 MG tablet Take 3 mg by mouth at bedtime.     Historical Provider, MD  TRULICITY 0.75 MG/0.5ML SOPN Inject 0.5 mLs into the skin once a week. mondays 11/10/15   Historical Provider, MD  venlafaxine (EFFEXOR) 75 MG tablet Take 75 mg by mouth 2 (two) times daily.    Historical Provider, MD    Allergies Patient has no  known allergies.  Family History  Problem Relation Age of Onset  . Breast cancer Neg Hx     Social History Social History  Substance Use Topics  . Smoking status: Never Smoker  . Smokeless tobacco: Never Used  . Alcohol use No    Review of Systems  Constitutional: No fever/chills. Eyes: No visual changes. ENT: No sore throat. Cardiovascular: Denies chest pain. Respiratory: Denies shortness of breath. Gastrointestinal: No abdominal pain.  Positive for nausea, no vomiting.  No diarrhea.  No  constipation. Genitourinary: Negative for dysuria. Musculoskeletal: Negative for back pain. Skin: Negative for rash. Neurological: Positive for dizziness. Positive for near syncope. Negative for headaches, focal weakness or numbness.  10-point ROS otherwise negative.  ____________________________________________   PHYSICAL EXAM:  VITAL SIGNS: ED Triage Vitals  Enc Vitals Group     BP 12/07/15 2324 (!) 125/99     Pulse Rate 12/07/15 2324 91     Resp 12/07/15 2324 18     Temp 12/07/15 2324 98.1 F (36.7 C)     Temp Source 12/07/15 2324 Oral     SpO2 12/07/15 2324 99 %     Weight 12/07/15 2325 170 lb (77.1 kg)     Height 12/07/15 2325 5\' 2"  (1.575 m)     Head Circumference --      Peak Flow --      Pain Score 12/07/15 2329 0     Pain Loc --      Pain Edu? --      Excl. in GC? --     Constitutional: Alert and oriented. Catatonic appearing and in no acute distress. Eyes: Conjunctivae are normal. PERRL. EOMI. Head: Atraumatic. Nose: No congestion/rhinnorhea. Mouth/Throat: Mucous membranes are moist.  Oropharynx non-erythematous. Neck: No stridor.  No carotid bruits. Cardiovascular: Normal rate, regular rhythm. Grossly normal heart sounds.  Good peripheral circulation. Respiratory: Normal respiratory effort.  No retractions. Lungs CTAB. Gastrointestinal: Soft and nontender. No distention. No abdominal bruits. No CVA tenderness. Musculoskeletal: No lower extremity tenderness nor edema.  No joint effusions. Neurologic:  Cognitive and psychomotor delay. Delayed speech and language. No gross focal neurologic deficits are appreciated.  Skin:  Skin is warm, dry and intact. No rash noted. Psychiatric: Mood and affect are flat. Speech and behavior are flat.  ____________________________________________   LABS (all labs ordered are listed, but only abnormal results are displayed)  Labs Reviewed  CBC WITH DIFFERENTIAL/PLATELET - Abnormal; Notable for the following:       Result  Value   Platelets 143 (*)    All other components within normal limits  COMPREHENSIVE METABOLIC PANEL - Abnormal; Notable for the following:    Sodium 131 (*)    Chloride 95 (*)    Glucose, Bld 148 (*)    Creatinine, Ser 1.15 (*)    GFR calc non Af Amer 55 (*)    All other components within normal limits  ACETAMINOPHEN LEVEL - Abnormal; Notable for the following:    Acetaminophen (Tylenol), Serum <10 (*)    All other components within normal limits  ETHANOL  SALICYLATE LEVEL  TROPONIN I  URINALYSIS COMPLETEWITH MICROSCOPIC ([facility] ONLY)  URINE DRUG SCREEN, QUALITATIVE ([facility] ONLY)   ____________________________________________  EKG  ED ECG REPORT I, Afifa Truax J, the attending physician, personally viewed and interpreted this ECG.   Date: 12/08/2015  EKG Time: 2325  Rate: 88  Rhythm: normal EKG, normal sinus rhythm  Axis: Normal  Intervals:none  ST&T Change: Nonspecific  ____________________________________________  RADIOLOGY  CT head  interpreted per Dr. Cherly Hensen: Unremarkable noncontrast CT of the head.  Portable chest x-ray (viewed by me, interpreted per Dr. Cherly Hensen):  Lungs mildly hypoexpanded. Mild bibasilar opacities likely reflect  atelectasis.   ___________________________________________   PROCEDURES  Procedure(s) performed: None  Procedures  Critical Care performed: No  ____________________________________________   INITIAL IMPRESSION / ASSESSMENT AND PLAN / ED COURSE  Pertinent labs & imaging results that were available during my care of the patient were reviewed by me and considered in my medical decision making (see chart for details).  50 year old female who presents with generalized weakness, dizziness and near syncope. Appears almost catatonic on my exam. Will obtain screening lab work, urinalysis, CT head.  Clinical Course as of Dec 07 204  Caleen Essex Dec 08, 2015  0206 Updated patient of laboratory and imaging results. Discussed with hospitalist  to evaluate patient in the emergency department for admission.  [JS]    Clinical Course User Index [JS] Irean Hong, MD     ____________________________________________   FINAL CLINICAL IMPRESSION(S) / ED DIAGNOSES  Final diagnoses:  Near syncope  Generalized weakness  Dizziness  Gait instability      NEW MEDICATIONS STARTED DURING THIS VISIT:  New Prescriptions   No medications on file     Note:  This document was prepared using Dragon voice recognition software and may include unintentional dictation errors.    Irean Hong, MD 12/08/15 [phone]

## 2015-12-08 NOTE — Consult Note (Signed)
[facility] Cardiology Consultation Note  Patient ID: Kristina Foster, MRN: [id number], DOB/AGE: July 28, 1965 50 y.o. Admit date: 12/07/2015   Date of Consult: 12/08/2015 Primary Physician: Derwood Kaplan, MD Primary Cardiologist: None  Chief Complaint:  Chief Complaint  Patient presents with  . Weakness  . Near Syncope   Reason for Consult: syncope  HPI: 50 y.o. female with known diabetes with complication essential hypertension makes hyperlipidemia and schizophrenia who is had an episode of syncope of unknown etiology. The patient did not hurt himself when having had total syncope and there has not been any other significant signs or symptoms of cardiac etiology. Patient does have hypertension for which the patient has had significant labile hypertension currently treated with lisinopril and metoprolol. The patient also has had treatment of high intensity cholesterol therapy with atorvastatin tolerating it well. It is unclear whether the patient has had any rhythm disturbances but currently there is been no further episodes of syncope. Troponin has been normal without evidence of myocardial infarction. Currently echocardiogram is pending  Past Medical History:  Diagnosis Date  . Anxiety   . Arthritis   . Constipation   . Depression   . Diabetes mellitus without complication (HCC)    Type 2   . Dizziness   . Hyperlipemia   . Hypertension   . Mental disability   . Schizo affective schizophrenia [facility])       Surgical History:  Past Surgical History:  Procedure Laterality Date  . ABDOMINAL HYSTERECTOMY    . BACK SURGERY       Home Meds: Prior to Admission medications   Medication Sig Start Date End Date Taking? Authorizing Provider  aspirin EC 81 MG tablet Take 81 mg by mouth daily.    Historical Provider, MD  atorvastatin (LIPITOR) 10 MG tablet Take 10 mg by mouth daily.    Historical Provider, MD  benztropine (COGENTIN) 2 MG tablet Take 2 mg by mouth 2 (two) times  daily.    Historical Provider, MD  colesevelam (WELCHOL) 625 MG tablet Take by mouth 3 (three) times daily with meals.     Historical Provider, MD  furosemide (LASIX) 40 MG tablet Take 1 tablet by mouth daily. 11/08/15   Historical Provider, MD  INGREZZA 40 MG CAPS Take 2 tablets by mouth daily. 11/09/15   Historical Provider, MD  insulin glargine (LANTUS) 100 UNIT/ML injection Inject 35 Units into the skin at bedtime.     Historical Provider, MD  lisinopril-hydrochlorothiazide (PRINZIDE,ZESTORETIC) 10-12.5 MG per tablet Take 1 tablet by mouth daily.    Historical Provider, MD  metoprolol succinate (TOPROL-XL) 100 MG 24 hr tablet Take 100 mg by mouth daily.    Historical Provider, MD  NOVOLOG MIX 70/30 FLEXPEN (70-30) 100 UNIT/ML FlexPen Inject 35 Units into the skin daily with breakfast.  11/15/15   Historical Provider, MD  potassium chloride SA (K-DUR,KLOR-CON) 20 MEQ tablet Take 1 tablet by mouth daily. 11/08/15   Historical Provider, MD  risperiDONE (RISPERDAL) 3 MG tablet Take 3 mg by mouth at bedtime.     Historical Provider, MD  TRULICITY 0.75 MG/0.5ML SOPN Inject 0.5 mLs into the skin once a week. mondays 11/10/15   Historical Provider, MD  venlafaxine (EFFEXOR) 75 MG tablet Take 75 mg by mouth 2 (two) times daily.    Historical Provider, MD    Inpatient Medications:  . aspirin EC  81 mg Oral Daily  . atorvastatin  10 mg Oral Daily  . benztropine  2 mg  Oral BID  . colesevelam  625 mg Oral TID WC  . docusate sodium  100 mg Oral BID  . enoxaparin (LOVENOX) injection  40 mg Subcutaneous Q24H  . feeding supplement (GLUCERNA SHAKE)  237 mL Oral TID BM  . furosemide  40 mg Oral Daily  . lisinopril  10 mg Oral Daily   And  . hydrochlorothiazide  12.5 mg Oral Daily  . insulin aspart  0-15 Units Subcutaneous TID WC  . insulin aspart  0-5 Units Subcutaneous QHS  . insulin glargine  24 Units Subcutaneous QHS  . metoprolol succinate  100 mg Oral Daily  . potassium chloride SA  20 mEq Oral  Daily  . risperiDONE  3 mg Oral QHS  . sodium chloride flush  3 mL Intravenous Q12H  . venlafaxine  75 mg Oral BID   . sodium chloride 125 mL/hr at 12/08/15 1311    Allergies: No Known Allergies  Social History   Social History  . Marital status: Single    Spouse name: N/A  . Number of children: N/A  . Years of education: N/A   Occupational History  . Not on file.   Social History Main Topics  . Smoking status: Never Smoker  . Smokeless tobacco: Never Used  . Alcohol use No  . Drug use: No  . Sexual activity: No   Other Topics Concern  . Not on file   Social History Narrative  . No narrative on file     Family History  Problem Relation Age of Onset  . Breast cancer Neg Hx      Review of Systems Positive for Shortness of breath syncope Negative for: General:  chills, fever, night sweats or weight changes.  Cardiovascular: PND orthopnea positive for syncope dizziness  Dermatological skin lesions rashes Respiratory: Cough congestion Urologic: Frequent urination urination at night and hematuria Abdominal: negative for nausea, vomiting, diarrhea, bright red blood per rectum, melena, or hematemesis Neurologic: negative for visual changes, and/or hearing changes  All other systems reviewed and are otherwise negative except as noted above.  Labs:  Recent Labs  12/07/15 2336  TROPONINI <0.03   Lab Results  Component Value Date   WBC 7.2 12/07/2015   HGB 12.7 12/07/2015   HCT 36.1 12/07/2015   MCV 89.9 12/07/2015   PLT 143 (L) 12/07/2015    Recent Labs Lab 12/07/15 2336  NA 131*  K 3.8  CL 95*  CO2 30  BUN 8  CREATININE 1.15*  CALCIUM 9.4  PROT 7.5  BILITOT 0.9  ALKPHOS 103  ALT 29  AST 35  GLUCOSE 148*   No results found for: CHOL, HDL, LDLCALC, TRIG No results found for: DDIMER  Radiology/Studies:  Ct Head Wo Contrast  Result Date: 12/08/2015 CLINICAL DATA:  Acute onset of confusion and dizziness. Generalized weakness and near  syncope. Initial encounter. EXAM: CT HEAD WITHOUT CONTRAST TECHNIQUE: Contiguous axial images were obtained from the base of the skull through the vertex without intravenous contrast. COMPARISON:  CT of the head and MRI of the brain performed 12/04/2015 FINDINGS: Brain: No evidence of acute infarction, hemorrhage, hydrocephalus, extra-axial collection or mass lesion/mass effect. The posterior fossa, including the cerebellum, brainstem and fourth ventricle, is within normal limits. The third and lateral ventricles, and basal ganglia are unremarkable in appearance. The cerebral hemispheres are symmetric in appearance, with normal gray-white differentiation. No mass effect or midline shift is seen. Vascular: No hyperdense vessel or unexpected calcification. Skull: There is no evidence of fracture;  visualized osseous structures are unremarkable in appearance. Stabbed orbits Sinuses/Orbits: The visualized portions of the orbits are within normal limits. The paranasal sinuses and mastoid air cells are well-aerated. Other: No significant soft tissue abnormalities are seen. IMPRESSION: Unremarkable noncontrast CT of the head. Electronically Signed   By: Roanna Raider M.D.   On: 12/08/2015 01:10   Ct Head Wo Contrast  Result Date: 12/04/2015 CLINICAL DATA:  Slight vascular calcification in the carotid siphon regions bilaterally. EXAM: CT HEAD WITHOUT CONTRAST TECHNIQUE: Contiguous axial images were obtained from the base of the skull through the vertex without intravenous contrast. COMPARISON:  None. FINDINGS: Brain: The ventricles are normal in size and configuration. There is no intracranial mass, hemorrhage, extra-axial fluid collection, or midline shift. There is slight small vessel disease in the right centrum semiovale bilaterally. Elsewhere gray-white compartments appear normal. No acute infarct is demonstrable on this study. Vascular: There is no evident hyperdense vessel. There is slight calcification in each  carotid siphon region. Skull: The bony calvarium appears intact. Sinuses/Orbits: Visualized paranasal sinuses are clear. Visualized orbits appear symmetric bilaterally. Other: The mastoid air cells are clear. IMPRESSION: Slight right centrum semiovale small vessel disease. No intracranial mass, hemorrhage, or extra-axial fluid collection. No acute infarct is demonstrable on this study. Critical Value/emergent results were called by telephone at the time of interpretation on 12/04/2015 at 10:40 am to Dr. Dorothea Glassman , who verbally acknowledged these results. Electronically Signed   By: Bretta Bang III M.D.   On: 12/04/2015 10:41   Mr Brain Wo Contrast  Result Date: 12/04/2015 CLINICAL DATA:  50 year old female with confusion, weakness, dysarthria. Initial encounter. EXAM: MRI HEAD WITHOUT CONTRAST TECHNIQUE: Multiplanar, multiecho pulse sequences of the brain and surrounding structures were obtained without intravenous contrast. COMPARISON:  Head CT without contrast 1032 hours today. FINDINGS: Brain: No restricted diffusion to suggest acute infarction. No midline shift, mass effect, evidence of mass lesion, ventriculomegaly, extra-axial collection or acute intracranial hemorrhage. Cervicomedullary junction and pituitary are within normal limits. Wallace Cullens and white matter signal is within normal limits for age throughout the brain. No cortical encephalomalacia or definite chronic cerebral blood products. Vascular: Major intracranial vascular flow voids are preserved. Skull and upper cervical spine: Negative. Sinuses/Orbits: Negative orbits soft tissues. Visualized paranasal sinuses and mastoids are stable and well pneumatized. Other: Visible internal auditory structures appear normal. Negative scalp soft tissues. IMPRESSION: No acute intracranial abnormality. Normal for age noncontrast MRI appearance of the brain. Electronically Signed   By: Odessa Fleming M.D.   On: 12/04/2015 13:43   Ct Abdomen Pelvis W  Contrast  Result Date: 12/04/2015 CLINICAL DATA:  50 year old female with abdominal pain and diarrhea. EXAM: CT ABDOMEN AND PELVIS WITH CONTRAST TECHNIQUE: Multidetector CT imaging of the abdomen and pelvis was performed using the standard protocol following bolus administration of intravenous contrast. CONTRAST:  1 ISOVUE-300 IOPAMIDOL (ISOVUE-300) INJECTION 61%, ISOVUE-300 IOPAMIDOL (ISOVUE-300) INJECTION 61% COMPARISON:  CT the abdomen and pelvis 11/26/2015. FINDINGS: Lower chest: Atherosclerotic calcifications in the left anterior descending and right coronary arteries. Hepatobiliary: No cystic or solid hepatic lesions. No intra or extrahepatic biliary ductal dilatation. Status post cholecystectomy. Pancreas: No pancreatic mass. No pancreatic ductal dilatation. No pancreatic or peripancreatic fluid or inflammatory changes. Spleen: Unremarkable. Adrenals/Urinary Tract: There are numerous small lesions scattered throughout the kidneys bilaterally (right greater than left), the majority of which are too small to definitively characterize, but are favored to represent tiny cysts. Several of the larger lesions are compatible with simple cysts, measuring  up to 2.2 cm in the interpolar region of the right kidney. Other lesions demonstrate heterogeneous internal attenuation (best appreciated in a 1.5 cm lesion in the interpolar region of the right kidney on coronal image 65 of series 6 where the lateral aspect of the lesion measures up to 46 HU), which could reflect proteinaceous/hemorrhagic contents (although no high attenuation areas were noted on the recent noncontrast CT examination) or areas of internal enhancement. No hydroureteronephrosis. Urinary bladder is normal in appearance. Bilateral adrenal glands are normal in appearance. Stomach/Bowel: The appearance of the stomach is normal. There is no pathologic dilatation of small bowel or colon. Normal appendix. Vascular/Lymphatic: Aortic atherosclerosis,  without evidence of aneurysm or dissection in the abdominal or pelvic vasculature. No lymphadenopathy noted in the abdomen or pelvis. Reproductive: Status post hysterectomy. Ovaries are not confidently identified may be surgically absent or atrophic. Other: No significant volume of ascites.  No pneumoperitoneum. Musculoskeletal: Status post PLIF at L4-L5 with interbody graft at the L4-L5 interspace. IMPRESSION: 1. No acute findings in the abdomen or pelvis. 2. Innumerable small lesions throughout the kidneys bilaterally. Although most of these are incompletely characterized, the majority of these lesions are favored to represent tiny cysts. Importantly, in the interpolar region of the right kidney there is an indeterminate lesion which has heterogeneous internal attenuation concerning for potential enhancement (particularly in light of the recent noncontrast CT the abdomen and pelvis which demonstrated no such intermediate to high attenuation in this area). Further evaluation with nonemergent MRI of the abdomen with and without IV gadolinium is recommended in the near future to to better characterize this lesion. 3. Normal appendix. 4. Aortic atherosclerosis, in addition to at least 2 vessel coronary artery disease. Please note that although the presence of coronary artery calcium documents the presence of coronary artery disease, the severity of this disease and any potential stenosis cannot be assessed on this non-gated CT examination. Assessment for potential risk factor modification, dietary therapy or pharmacologic therapy may be warranted, if clinically indicated. 5. Additional incidental findings, as above. Electronically Signed   By: Trudie Reedaniel  Entrikin M.D.   On: 12/04/2015 13:07   Dg Chest Port 1 View  Result Date: 12/08/2015 CLINICAL DATA:  Acute onset of confusion and dizziness. Initial encounter. EXAM: PORTABLE CHEST 1 VIEW COMPARISON:  Chest radiograph performed 07/12/2006 FINDINGS: The lungs are  mildly hypoexpanded. Mild bibasilar opacities likely reflect atelectasis. There is no evidence of pleural effusion or pneumothorax. The cardiomediastinal silhouette is borderline normal in size. No acute osseous abnormalities are seen. IMPRESSION: Lungs mildly hypoexpanded. Mild bibasilar opacities likely reflect atelectasis. Electronically Signed   By: Roanna RaiderJeffery  Chang M.D.   On: 12/08/2015 01:11   Dg Abd 2 Views  Result Date: 12/04/2015 CLINICAL DATA:  Lower abdominal pain for 2 days.  Constipation. EXAM: ABDOMEN - 2 VIEW COMPARISON:  04/27/2014 FINDINGS: There is a non obstructive bowel gas pattern. No supine evidence of free air. No organomegaly or suspicious calcification. No significant increase in stool burden. No acute bony abnormality. Postsurgical changes in the lower lumbar spine. IMPRESSION: Negative. Electronically Signed   By: Charlett NoseKevin  Dover M.D.   On: 12/04/2015 09:49   Mm Screening Breast Tomo Bilateral  Result Date: 12/07/2015 CLINICAL DATA:  Screening. EXAM: 2D DIGITAL SCREENING BILATERAL MAMMOGRAM WITH CAD AND ADJUNCT TOMO COMPARISON:  Previous exam(s). ACR Breast Density Category c: The breast tissue is heterogeneously dense, which may obscure small masses. FINDINGS: There are no findings suspicious for malignancy. Images were processed with CAD. IMPRESSION:  No mammographic evidence of malignancy. A result letter of this screening mammogram will be mailed directly to the patient. RECOMMENDATION: Screening mammogram in one year. (Code:SM-B-01Y) BI-RADS CATEGORY  1: Negative. Electronically Signed   By: Edwin CapJennifer  Jarosz M.D.   On: 12/07/2015 09:47   Ct Renal Stone Study  Result Date: 11/26/2015 CLINICAL DATA:  Lower abdominal pain with nausea and vomiting for 3 days. EXAM: CT ABDOMEN AND PELVIS WITHOUT CONTRAST TECHNIQUE: Multidetector CT imaging of the abdomen and pelvis was performed following the standard protocol without IV contrast. COMPARISON:  None. FINDINGS: Lower chest: No acute  findings. Coronary artery calcification noted. Hepatobiliary: No mass visualized on this unenhanced exam. Prior cholecystectomy noted. No evidence of biliary dilatation. Pancreas: No mass or inflammatory process visualized on this unenhanced exam. Spleen:  Within normal limits in size. Adrenals/Urinary tract: No evidence of urolithiasis or hydronephrosis. Unremarkable appearance of bladder. Stomach/Bowel: No evidence of obstruction, inflammatory process, or abnormal fluid collections. Moderate to large colonic stool burden noted. Vascular/Lymphatic: No pathologically enlarged lymph nodes identified. No evidence of abdominal aortic aneurysm. Aortic atherosclerosis. Reproductive: Prior hysterectomy noted. Adnexal regions are unremarkable in appearance. Other:  None. Musculoskeletal: No suspicious bone lesions identified. PLIF hardware at L4-5. IMPRESSION: No acute findings. Moderate to large stool burden noted; suggest clinical correlation for possible constipation. Aortic and coronary artery atherosclerosis. Electronically Signed   By: Myles RosenthalJohn  Stahl M.D.   On: 11/26/2015 15:58    EKG: Normal sinus rhythm  Weights: Filed Weights   12/07/15 2325 12/07/15 2329 12/08/15 0438  Weight: 77.1 kg (170 lb) 79.8 kg (176 lb) 77.5 kg (170 lb 14.4 oz)     Physical Exam: Blood pressure 120/74, pulse 88, temperature 98.4 F (36.9 C), temperature source Oral, resp. rate 18, height 5\' 2"  (1.575 m), weight 77.5 kg (170 lb 14.4 oz), SpO2 100 %. Body mass index is 31.26 kg/m. General: Well developed, well nourished, in no acute distress. Head eyes ears nose throat: Normocephalic, atraumatic, sclera non-icteric, no xanthomas, nares are without discharge. No apparent thyromegaly and/or mass  Lungs: Normal respiratory effort.  no wheezes, no rales, no rhonchi.  Heart: RRR with normal S1 S2. no murmur gallop, no rub, PMI is normal size and placement, carotid upstroke normal without bruit, jugular venous pressure is  normal Abdomen: Soft, non-tender, non-distended with normoactive bowel sounds. No hepatomegaly. No rebound/guarding. No obvious abdominal masses. Abdominal aorta is normal size without bruit Extremities: 1-2+ edema. no cyanosis, no clubbing, no ulcers  Peripheral : 2+ bilateral upper extremity pulses, 2+ bilateral femoral pulses, 2+ bilateral dorsal pedal pulse Neuro: Alert and oriented. No facial asymmetry. No focal deficit. Moves all extremities spontaneously. Musculoskeletal: Normal muscle tone without kyphosis Psych:  Responds to questions appropriately with a normal affect.    Assessment: 50 year old female with diabetes with complications essential hypertension mixed hyperlipidemia having an episode of syncope of unknown etiology currently relatively stable from the cardiac standpoint without evidence of myocardial infarction  Plan: 1. Continue medication management for hypertension control with current medical regimen without change 2. Furosemide for lower extremity edema 3. No further cardiac diagnostics necessary at this time after echocardiogram for LV systolic dysfunction valvular heart disease contributing to above 4. In ambulation following for rhythm disturbances or other causes of the syncope presyncope including medication management  Signed, Lamar BlinksBruce J Kowalski M.D. [facility] PinevilleFACC [facility] 12/08/2015, 5:30 PM

## 2015-12-08 NOTE — Progress Notes (Signed)
Initial Nutrition Assessment  DOCUMENTATION CODES:   Obesity unspecified  INTERVENTION:  Patient currently ordered for Soft Diet, but this is a GI diet (does not provide chopped meats). Changed patient to Dysphagia 3 diet in setting of difficulty chewing. Also modified that patient will need assist ordering (especially if caregiver not present).  Provide Glucerna Shake po TID, each supplement provides 220 kcal and 10 grams of protein.  Ordered bedtime snack for patient per her request (Kozy Shack Chocolate No Sugar Added). Will arrive at 8pm.  Encouraged caregiver to still provide balanced meals, but to modify texture for patient until she gets her dentures (chopped meats, soft foods and protein, soft fruit and vegetables).  NUTRITION DIAGNOSIS:   Inadequate oral intake related to other (see comment) (difficulty chewing) as evidenced by percent weight loss, per patient/family report.  GOAL:   Patient will meet greater than or equal to 90% of their needs  MONITOR:   PO intake, Supplement acceptance, Labs, Weight trends, I & O's  REASON FOR ASSESSMENT:   Malnutrition Screening Tool    ASSESSMENT:   50 year old female with medical history of tardive dyskinesia, type 2 diabetes mellitus without complication, schizo affective schizophrenia, mental disability presents to the emergency department after a fall.   Spoke with patient and her caregiver at bedside. Reports her appetite is good. She had all of her teeth removed 4-6 weeks ago and they do not have dentures made for patient yet. Per caregiver, plan was to allow her mouth time to heal before making mold for the dentures. Since teeth extraction, she has had difficulty chewing and has mainly been eating oatmeal and applesauce, and on one occasion had eggs with cheese. She was still eating three meals daily and also eats a snack in setting of her diabetes, but just had low calorie and protein intake in setting of difficulty  chewing.  Report UBW of 178 lbs and endorse patient has lost some weight. Patient has lost 8 lbs (4% body weight) over 1 month, which is not significant for time frame.  Meal Completion: 100%  Medications reviewed and include: Colace, Lasix 40 mg daily, Novolog sliding scale TID with meals and daily at bedtime, Lantus 24 units daily at bedtime, potassium chloride 20 mEq daily, NS @ 125 ml/hr.  Labs reviewed: CBG 131-175 since adm today, Sodium 131, Chloride 95, Creatinine 1.15, EGFR 55. Last HgbA1c was 6.9 on 08/02/2014.  Nutrition-Focused physical exam completed. Findings are no fat depletion, no muscle depletion, and no edema.   Discussed with RN.   Diet Order:  DIET DYS 3 Room service appropriate? Yes with Assist; Fluid consistency: Thin  Skin:  Reviewed, no issues  Last BM:  12/06/2015  Height:   Ht Readings from Last 1 Encounters:  12/08/15 '5\' 2"'$  (1.575 m)    Weight:   Wt Readings from Last 1 Encounters:  12/08/15 170 lb 14.4 oz (77.5 kg)    Ideal Body Weight:  50 kg  BMI:  Body mass index is 31.26 kg/m.  Estimated Nutritional Needs:   Kcal:  1625-1760 (MSJ x 1.2-1.3)  Protein:  80-93 grams (1-1.2 grams/kg)  Fluid:  >/= 1.6 L/day  EDUCATION NEEDS:   Education needs addressed (Encouraged intake of well-balanced meals modified (chopped) for patient's difficulty chewing. Important to still receive enough calories and protein while waiting on dentures.)  Willey Blade, MS, RD, LDN Pager: [phone] After Hours Pager: [phone]

## 2015-12-08 NOTE — Progress Notes (Signed)
  Echocardiogram 2D Echocardiogram with a bubble study has been performed.  Leta JunglingCooper, Fleur Audino M 12/08/2015, 1:06 PM
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
## 2015-12-08 NOTE — ED Notes (Signed)
Pt ambulatory to toilet with 2 assist.

## 2015-12-08 NOTE — Care Management Obs Status (Signed)
MEDICARE OBSERVATION STATUS NOTIFICATION   Patient Details  Name: Kristina Foster MRN: [id number] Date of Birth: 04/23/1965   Medicare Observation Status Notification Given:  Yes    Marily MemosLisa M Avan Gullett, RN 12/08/2015, 10:30 AM

## 2015-12-09 DIAGNOSIS — R55 Syncope and collapse: Secondary | ICD-10-CM | POA: Diagnosis not present

## 2015-12-09 LAB — BASIC METABOLIC PANEL
Anion gap: 4 — ABNORMAL LOW (ref 5–15)
BUN: 8 mg/dL (ref 6–20)
CHLORIDE: 106 mmol/L (ref 101–111)
CO2: 27 mmol/L (ref 22–32)
CREATININE: 1.16 mg/dL — AB (ref 0.44–1.00)
Calcium: 8.7 mg/dL — ABNORMAL LOW (ref 8.9–10.3)
GFR calc Af Amer: >60 mL/min (ref >60)
GFR calc non Af Amer: 54 mL/min — ABNORMAL LOW (ref >60)
GLUCOSE: 140 mg/dL — AB (ref 65–99)
POTASSIUM: 3.7 mmol/L (ref 3.5–5.1)
SODIUM: 137 mmol/L (ref 135–145)

## 2015-12-09 LAB — GLUCOSE, CAPILLARY
Glucose-Capillary: 126 mg/dL — ABNORMAL HIGH (ref 65–99)
Glucose-Capillary: 201 mg/dL — ABNORMAL HIGH (ref 65–99)
Glucose-Capillary: 124 mg/dL — ABNORMAL HIGH (ref 65–99)
Glucose-Capillary: 164 mg/dL — ABNORMAL HIGH (ref 65–99)

## 2015-12-09 LAB — CBC
HEMATOCRIT: 33.8 % — AB (ref 35.0–47.0)
Hemoglobin: 12 g/dL (ref 12.0–16.0)
MCH: 32.4 pg (ref 26.0–34.0)
MCHC: 35.5 g/dL (ref 32.0–36.0)
MCV: 91.3 fL (ref 80.0–100.0)
Platelets: 125 10*3/uL — ABNORMAL LOW (ref 150–440)
RBC: 3.7 MIL/uL — ABNORMAL LOW (ref 3.80–5.20)
RDW: 13.5 % (ref 11.5–14.5)
WBC: 5.4 10*3/uL (ref 3.6–11.0)

## 2015-12-09 LAB — HEMOGLOBIN A1C
Hgb A1c MFr Bld: 6.4 % — ABNORMAL HIGH (ref 4.8–5.6)
Mean Plasma Glucose: 137 mg/dL

## 2015-12-09 NOTE — Progress Notes (Signed)
While CH was making rounds on unit 1a, a legal guardian of a Pt in room 143A asked CH to visit with her Pt. The Pt had previously asked for Advanced Directives while she was in Ed. CH proved education and a brochure to the Pt and her aid then. Now Pt wanted to know if she could proceed to complete the Ad, but her legal aid did not have the brochure with her at the time but promised to bring it on Monday to have completed. In the meantime, Pt requested for prayers, which [facility].     12/09/15 2100  Clinical Encounter Type  Visited With Patient  Visit Type Follow-up;Spiritual support;ED  Referral From Family  Consult/Referral To Chaplain  Spiritual Encounters  Spiritual Needs Prayer  Stress Factors  Patient Stress Factors Other (Comment)  Family Stress Factors Other (Comment)  Advance Directives (For Healthcare)  Does patient have an advance directive? No  Would patient like information on creating an advanced directive? Yes - Transport plannerducational materials given

## 2015-12-09 NOTE — Progress Notes (Signed)
Patient ID: Kristina BanePatricia A Foster, female   DOB: 06/13/1965, 50 y.o.   MRN: [id number]   [facility] [place] [facility]   PATIENT NAME: Kristina Foster    MR#:  [id number]  DATE OF BIRTH:  10/03/1965  SUBJECTIVE:  CHIEF COMPLAINT:   Chief Complaint  Patient presents with  . Weakness  . Near Syncope   Patient seen and examined at bedside. Denies any complaints of pain, shortness of breath, nausea, vomiting. States that she has had urine output. REVIEW OF SYSTEMS:  ROS Constitutional: Negative for chillsand fever.  HENT: Negative for sore throatand tinnitus.  Eyes: Negative for blurred visionand redness.  Respiratory: Negative for coughand shortness of breath.  Cardiovascular: Negative for chest pain, palpitations, orthopneaand PND.  Gastrointestinal: Negative for abdominal pain, diarrhea, nauseaand vomiting.  Genitourinary: Negative for dysuria, frequencyand urgency.  Musculoskeletal: Negative for joint painand myalgias.  Skin: Negative for rash.  Neurological: Negative for speech change, focal weaknessand weakness.  Endo/Heme/Allergies: Does not bruise/bleed easily.  Psychiatric/Behavioral: Negative for depressionand suicidal ideas.  DRUG ALLERGIES:    DRUG ALLERGIES:  No Known Allergies VITALS:  Blood pressure (!) 95/55, pulse (!) 105, temperature 98.2 F (36.8 C), temperature source Oral, resp. rate 18, height 5\' 2"  (1.575 m), weight 78.2 kg (172 lb 4.8 oz), SpO2 100 %. PHYSICAL EXAMINATION:  Physical Exam  Vitalsreviewed. Constitutional: She is oriented to person, place, and time. She appears well-developedand well-nourished. No distress.  HENT:  Head: Normocephalicand atraumatic.  Mouth/Throat: Oropharynx is clear and moist.  Eyes: Conjunctivaeand EOMare normal. Pupils are equal, round, and reactive to light.  Neck: Normal range of motion. Neck supple. No JVDpresent. No tracheal deviationpresent. No thyromegalypresent.   Cardiovascular: Normal rate, regular rhythmand normal heart sounds. Exam reveals no gallopand no friction rub.  No murmurheard. Respiratory: Effort normaland breath sounds normal.  GI: Soft. Bowel sounds are normal. She exhibits no distension. There is no tenderness.  Musculoskeletal: Normal range of motion. She exhibits no edema.  Lymphadenopathy:  She has no cervical adenopathy.  Neurological: She is alertand oriented to person, place, and time. No cranial nerve deficit. She exhibits normal muscle tone.  Skin: Skin is warmand dry. No rashnoted. No erythema.  Psychiatric: She has a normal mood and affect. Her behavior is normal. Judgmentand thought contentnormal.  LABORATORY PANEL:   CBC  Recent Labs Lab 12/09/15 0345  WBC 5.4  HGB 12.0  HCT 33.8*  PLT 125*   ------------------------------------------------------------------------------------------------------------------ Chemistries   Recent Labs Lab 12/07/15 2336 12/09/15 0345  NA 131* 137  K 3.8 3.7  CL 95* 106  CO2 30 27  GLUCOSE 148* 140*  BUN 8 8  CREATININE 1.15* 1.16*  CALCIUM 9.4 8.7*  AST 35  --   ALT 29  --   ALKPHOS 103  --   BILITOT 0.9  --    RADIOLOGY:  No results found. ASSESSMENT AND PLAN:  This is a 50 year old female admitted for fall and near syncope.  1. Fall: Mechanical versus near syncopal secondary to weakness and dizziness. Cardiology consultation and recommendations appreciated. Echo done and results significant for LV systolic dysfunction and valvular heart disease  2. Tardive dyskinesia: Continue Cogentin per home regimen. 3. Hypertension: Controlled, perhaps too well. Check orthostatic vital signs.  Continue metoprolol and lisinopril.  4. Diabetes mellitus type 2: Hold mixed insulin. Adjust basal insulin and provide sliding scale insulin while hospitalized. 5. Hyperlipidemia: Continue WelChol 6. Schizophrenia: Hold Risperdal for now. May restart in 24-48 hours to  observe for improvement in dystonia. 7. DVT prophylaxis: Lovenox 8. GI prophylaxis: None We'll request physical therapy evaluation for discharge planning.  All the records are reviewed and case discussed with Care Management/Social Worker. Management plans discussed with the patient, family and they are in agreement.  CODE STATUS: Full  TOTAL TIME TAKING CARE OF THIS PATIENT: 30 minutes.   More than 50% of the time was spent in counseling/coordination of care: YES  POSSIBLE D/C IN 1-2 DAYS, DEPENDING ON CLINICAL CONDITION.   Ellene Bloodsaw D.O. on 12/09/2015 at 1:10 PM  Between 7am to 6pm - Pager - [phone]  After 6pm go to [email or web address] - Social research officer, governmentpassword EPAS [facility]  [facility] [place] [facility]  Office  [phone]  CC: Primary care physician; Derwood KaplanEason,  Ernest B, MD  Note: This dictation was prepared with Dragon dictation along with smaller phrase technology. Any transcriptional errors that result from this process are unintentional.

## 2015-12-10 DIAGNOSIS — R55 Syncope and collapse: Secondary | ICD-10-CM | POA: Diagnosis not present

## 2015-12-10 LAB — BASIC METABOLIC PANEL
ANION GAP: 5 (ref 5–15)
BUN: 9 mg/dL (ref 6–20)
CHLORIDE: 110 mmol/L (ref 101–111)
CO2: 25 mmol/L (ref 22–32)
Calcium: 8.8 mg/dL — ABNORMAL LOW (ref 8.9–10.3)
Creatinine, Ser: 1.11 mg/dL — ABNORMAL HIGH (ref 0.44–1.00)
GFR calc Af Amer: >60 mL/min (ref >60)
GFR, EST NON AFRICAN AMERICAN: 57 mL/min — AB (ref >60)
Glucose, Bld: 141 mg/dL — ABNORMAL HIGH (ref 65–99)
POTASSIUM: 3.9 mmol/L (ref 3.5–5.1)
SODIUM: 140 mmol/L (ref 135–145)

## 2015-12-10 LAB — GLUCOSE, CAPILLARY
Glucose-Capillary: 124 mg/dL — ABNORMAL HIGH (ref 65–99)
Glucose-Capillary: 158 mg/dL — ABNORMAL HIGH (ref 65–99)
Glucose-Capillary: 191 mg/dL — ABNORMAL HIGH (ref 65–99)

## 2015-12-10 NOTE — Care Management Note (Signed)
Case Management Note  Patient Details  Name: Kristina Foster MRN: [id number] Date of Birth: 11/03/1965  Subjective/Objective:      Call to Dr Emmit PomfretHugelmeyer requesting an order for home health PT and  a Face to Face so that Kristina Foster at [facility] can accept the order. . Request for a RW was faxed to [facility].              Action/Plan:   Expected Discharge Date:                  Expected Discharge Plan:     In-House Referral:     Discharge planning Services     Post Acute Care Choice:    Choice offered to:     DME Arranged:    DME Agency:     HH Arranged:    HH Agency:     Status of Service:     If discussed at [employer] Length of Stay Meetings, dates discussed:    Additional Comments:  Kristina Foster A, RN 12/10/2015, 4:14 PM

## 2015-12-10 NOTE — NC FL2 (Signed)
[place] MEDICAID FL2 LEVEL OF CARE SCREENING TOOL     IDENTIFICATION  Patient Name: Kristina Foster Birthdate: 04/10/1965 Sex: female Admission Date (Current Location): 12/07/2015  [facility] [place] [place]   Facility and Address:  [facility], [address], [place], [place] [address]      Provider Number: [id number]  Attending Physician Name and Address:  Tonye RoyaltyAlexis Hugelmeyer, DO  Relative Name and Phone Number:       Current Level of Care: Hospital Recommended Level of Care: Family Care Home Prior Approval Number:    Date Approved/Denied:   PASRR Number:    Discharge Plan: Other (Comment) [facility]    Current Diagnoses: Patient Active Problem List   Diagnosis Date Noted  . Syncope 12/08/2015  . Lumbar spondylosis 08/02/2014    Orientation RESPIRATION BLADDER Height & Weight     Self, Time  Normal Continent Weight: 180 lb 8 oz (81.9 kg) Height:  5\' 2"  (157.5 cm)  BEHAVIORAL SYMPTOMS/MOOD NEUROLOGICAL BOWEL NUTRITION STATUS      Continent    AMBULATORY STATUS COMMUNICATION OF NEEDS Skin   Extensive Assist Verbally Normal                       Personal Care Assistance Level of Assistance  Bathing, Feeding, Dressing, Total care Bathing Assistance: Limited assistance Feeding assistance: Independent Dressing Assistance: Limited assistance Total Care Assistance: Independent   Functional Limitations Info             SPECIAL CARE FACTORS FREQUENCY                       Contractures Contractures Info: Present    Additional Factors Info                  Current Medications (12/10/2015):  This is the current hospital active medication list Current Facility-Administered Medications  Medication Dose Route Frequency Provider Last Rate Last Dose  . 0.9 %  sodium chloride infusion   Intravenous Continuous Arnaldo NatalMichael S Diamond, MD 125 mL/hr at 12/10/15 0256    . acetaminophen (TYLENOL) tablet 650 mg  650 mg  Oral Q6H PRN Arnaldo NatalMichael S Diamond, MD       Or  . acetaminophen (TYLENOL) suppository 650 mg  650 mg Rectal Q6H PRN Arnaldo NatalMichael S Diamond, MD      . aspirin EC tablet 81 mg  81 mg Oral Daily Arnaldo NatalMichael S Diamond, MD   81 mg at 12/10/15 1012  . atorvastatin (LIPITOR) tablet 10 mg  10 mg Oral Daily Arnaldo NatalMichael S Diamond, MD   10 mg at 12/10/15 1012  . benztropine (COGENTIN) tablet 2 mg  2 mg Oral BID Arnaldo NatalMichael S Diamond, MD   2 mg at 12/10/15 1012  . colesevelam [facility](WELCHOL) tablet 625 mg  625 mg Oral TID WC Arnaldo NatalMichael S Diamond, MD   625 mg at 12/10/15 0825  . docusate sodium (COLACE) capsule 100 mg  100 mg Oral BID Arnaldo NatalMichael S Diamond, MD   100 mg at 12/09/15 2034  . enoxaparin (LOVENOX) injection 40 mg  40 mg Subcutaneous Q24H Arnaldo NatalMichael S Diamond, MD   40 mg at 12/09/15 2032  . feeding supplement (GLUCERNA SHAKE) (GLUCERNA SHAKE) liquid 237 mL  237 mL Oral TID BM Alexis Hugelmeyer, DO   237 mL at 12/10/15 1023  . furosemide (LASIX) tablet 40 mg  40 mg Oral Daily Arnaldo NatalMichael S Diamond, MD   40 mg at 12/10/15 1012  .  lisinopril (PRINIVIL,ZESTRIL) tablet 10 mg  10 mg Oral Daily Arnaldo NatalMichael S Diamond, MD   10 mg at 12/10/15 1012   And  . hydrochlorothiazide (MICROZIDE) capsule 12.5 mg  12.5 mg Oral Daily Arnaldo NatalMichael S Diamond, MD   12.5 mg at 12/10/15 1012  . insulin aspart (novoLOG) injection 0-15 Units  0-15 Units Subcutaneous TID WC Arnaldo NatalMichael S Diamond, MD   2 Units at 12/10/15 [phone]  . insulin aspart (novoLOG) injection 0-5 Units  0-5 Units Subcutaneous QHS Arnaldo NatalMichael S Diamond, MD      . insulin glargine (LANTUS) injection 24 Units  24 Units Subcutaneous QHS Arnaldo NatalMichael S Diamond, MD   24 Units at 12/09/15 2120  . metoprolol succinate (TOPROL-XL) 24 hr tablet 100 mg  100 mg Oral Daily Arnaldo NatalMichael S Diamond, MD   100 mg at 12/10/15 1012  . ondansetron (ZOFRAN) tablet 4 mg  4 mg Oral Q6H PRN Arnaldo NatalMichael S Diamond, MD       Or  . ondansetron [facility](ZOFRAN) injection 4 mg  4 mg Intravenous Q6H PRN Arnaldo NatalMichael S Diamond, MD      . potassium chloride SA  (K-DUR,KLOR-CON) CR tablet 20 mEq  20 mEq Oral Daily Arnaldo NatalMichael S Diamond, MD   20 mEq at 12/10/15 1012  . risperiDONE (RISPERDAL) tablet 3 mg  3 mg Oral QHS Arnaldo NatalMichael S Diamond, MD   3 mg at 12/09/15 2034  . sodium chloride flush (NS) 0.9 % injection 3 mL  3 mL Intravenous Q12H Arnaldo NatalMichael S Diamond, MD   3 mL at 12/09/15 2033  . venlafaxine (EFFEXOR) tablet 75 mg  75 mg Oral BID Arnaldo NatalMichael S Diamond, MD   75 mg at 12/10/15 1012     Discharge Medications: DISCHARGE MEDICATIONS:     Medication List    TAKE these medications   aspirin EC 81 MG tablet Take 81 mg by mouth daily.  atorvastatin 10 MG tablet Commonly known as:  LIPITOR Take 10 mg by mouth daily.  benztropine 2 MG tablet Commonly known as:  COGENTIN Take 2 mg by mouth 2 (two) times daily.  colesevelam 625 MG tablet Commonly known as:  WELCHOL Take by mouth 3 (three) times daily with meals.  furosemide 40 MG tablet Commonly known as:  LASIX Take 1 tablet by mouth daily.  INGREZZA 40 MG Caps Generic drug:  Valbenazine Tosylate Take 2 tablets by mouth daily.  insulin glargine 100 UNIT/ML injection Commonly known as:  LANTUS Inject 35 Units into the skin at bedtime.  lisinopril-hydrochlorothiazide 10-12.5 MG tablet Commonly known as:  PRINZIDE,ZESTORETIC Take 1 tablet by mouth daily.  metoprolol succinate 100 MG 24 hr tablet Commonly known as:  TOPROL-XL Take 100 mg by mouth daily.  NOVOLOG MIX 70/30 FLEXPEN (70-30) 100 UNIT/ML FlexPen Generic drug:  insulin aspart protamine - aspart Inject 35 Units into the skin daily with breakfast.  potassium chloride SA 20 MEQ tablet Commonly known as:  K-DUR,KLOR-CON Take 1 tablet by mouth daily.  risperiDONE 3 MG tablet Commonly known as:  RISPERDAL Take 3 mg by mouth at bedtime.  TRULICITY 0.75 MG/0.5ML Sopn Generic drug:  Dulaglutide Inject 0.5 mLs into the skin once a week. mondays  venlafaxine 75 MG tablet Commonly known as:  EFFEXOR Take 75 mg by mouth 2 (two) times  daily.       Relevant Imaging Results:  Relevant Lab Results:   Additional Information    Judi CongKaren M Rande Roylance, LCSW

## 2015-12-10 NOTE — Evaluation (Addendum)
Physical Therapy Evaluation Patient Details Name: Kristina Foster MRN: [id number] DOB: 12/10/1965 Today's Date: 12/10/2015   History of Present Illness  Kristina Foster is a 50yo black female who [facility] after sustaining a fall with caregiver at home, syncope v mechanical fall. Attending MD suspects that cardiac history is low likelihood contributory at this time. Pt lives in a group home, performing limited community distances with RW. 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 [place] as MD felt it was worsenign affective/motoric symptoms.   Clinical Impression  Upon entry, the patient is received semirecumbent in bed, no family/caregiver present. The pt is awake and agreeable to participate. No acute distress noted at this time. Pt reports she is still hungry despite breakfast and asks for a banana. The pt is alert and oriented to self, pleasant, conversational, and following simple and multi-step commands consistently. Functional mobility assessment demonstrates mild weakness during gait, with maximal gait speed less than 0.7532m/s, and decreasing after first 100'. Pt reports she feels much weaker than her baseline, but has difficulty with steps at her group home at baseline. Pt reports some falls history, but provides little detail: I suspect limitations as a historian.   Patient presenting with impairment of strength, balance, and activity tolerance, limiting ability to perform IADL and mobility tasks at  baseline and safe level of function. Patient will benefit from skilled intervention to address the above impairments and limitations, in order to restore to prior level of function, improve patient safety upon discharge, and to decrease falls risk.       Follow Up Recommendations Home health PT;Other (comment);Supervision - Intermittent;Supervision/Assistance - 24 hour (Return to group home with HHPT> )    Equipment  Recommendations  Rolling walker with 5" wheels (Unclear if patient has a RW at group home or is borrowing. )    Recommendations for Other Services       Precautions / Restrictions Precautions Precaution Comments: no score available.       Mobility  Bed Mobility               General bed mobility comments: Recieved up in chair.   Transfers Overall transfer level: Modified independent Equipment used: None Transfers: Sit to/from Stand              Ambulation/Gait Ambulation/Gait assistance: Supervision Ambulation Distance (Feet): 320 Feet Assistive device: Rolling walker (2 wheeled) Gait Pattern/deviations: WFL(Within Functional Limits);Step-through pattern Gait velocity: 0.7372m/s (first lap; second lap much slower due to BLE fatigue.)  Gait velocity interpretation: <1.8 ft/sec, indicative of risk for recurrent falls    Stairs            Wheelchair Mobility    Modified Rankin (Stroke Patients Only)       Balance Overall balance assessment: Modified Independent;No apparent balance deficits (not formally assessed);History of Falls                                           Pertinent Vitals/Pain Pain Assessment: No/denies pain    Home Living Family/patient expects to be discharged to:: Group home                 Additional Comments: Requires mod-max assist for ADL, IADL.     Prior Function           Comments:  Requires mod-max assist for ADL, IADL.      Hand Dominance   Dominant Hand: Right    Extremity/Trunk Assessment                         Communication   Communication: No difficulties  Cognition                            General Comments      Exercises     Assessment/Plan    PT Assessment Patient needs continued PT services  PT Problem List Decreased strength;Decreased cognition;Obesity;Decreased knowledge of use of DME;Decreased activity tolerance;Decreased mobility           PT Treatment Interventions Gait training;Stair training;DME instruction;Functional mobility training;Therapeutic activities;Therapeutic exercise;Balance training;Patient/family education    PT Goals (Current goals can be found in the Care Plan section)  Acute Rehab PT Goals Patient Stated Goal: improve independence andsafety with limited community distance AMB.  PT Goal Formulation: With patient Time For Goal Achievement: 12/24/15 Potential to Achieve Goals: Fair    Frequency Min 2X/week   Barriers to discharge   Patient has some stairs to enter group home.     Co-evaluation               End of Session Equipment Utilized During Treatment: Gait belt Activity Tolerance: Patient tolerated treatment well;Patient limited by fatigue;No increased pain Patient left: in chair;with call bell/phone within reach;with chair alarm set      Functional Assessment Tool Used: Clinical Judgment  Functional Limitation: Mobility: Walking and moving around Mobility: Walking and Moving Around Current Status ([id number](G8978): At least 20 percent but less than 40 percent impaired, limited or restricted Mobility: Walking and Moving Around Goal Status [phone]([phone]): At least 1 percent but less than 20 percent impaired, limited or restricted    Time: 0938-0958 PT Time Calculation (min) (ACUTE ONLY): 20 min   Charges:       12/10/15 1009  PT Time Calculation  PT Start Time (ACUTE ONLY) [id number]  PT Stop Time (ACUTE ONLY) 0958  PT Time Calculation (min) (ACUTE ONLY) 20 min  PT G-Codes **NOT FOR INPATIENT CLASS**  Functional Assessment Tool Used Clinical Judgment   Functional Limitation Mobility: Walking and moving around  Mobility: Walking and Moving Around Current Status (X9147(G8978) CJ  Mobility: Walking and Moving Around Goal Status (W2956(G8979) CI  PT General Charges  $$ ACUTE PT VISIT 1 Procedure  PT Evaluation  $PT Eval Moderate Complexity 1 Procedure  PT Treatments  $Therapeutic Activity 8-22 mins         PT G Codes:   PT G-Codes **NOT FOR INPATIENT CLASS** Functional Assessment Tool Used: Clinical Judgment  Functional Limitation: Mobility: Walking and moving around Mobility: Walking and Moving Around Current Status (O1308(G8978): At least 20 percent but less than 40 percent impaired, limited or restricted Mobility: Walking and Moving Around Goal Status [phone]([phone]): At least 1 percent but less than 20 percent impaired, limited or restricted    10:13 AM, 12/10/15 Rosamaria LintsAllan C Nation Cradle, PT, DPT Physical Therapist - [place] [phone] [facility](ASCOM)  [phone] (mobile)    *Addended on 01/02/16 to include charges for this visit based on objective findings from this visit.   2:24 PM, 01/02/16 Rosamaria LintsAllan C Mj Willis, PT, DPT Physical Therapist - [place] [phone](ASCOM)  [phone] (mobile)

## 2015-12-10 NOTE — Discharge Summary (Signed)
[facility] - [place] at [facility]   PATIENT NAME: Kristina Commanderatricia Sailors    MR#:  [id number]  DATE OF BIRTH:  05/26/1965  DATE OF ADMISSION:  12/07/2015   ADMITTING PHYSICIAN: Arnaldo NatalMichael S Diamond, MD  DATE OF DISCHARGE: 12/10/15  PRIMARY CARE PHYSICIAN: Derwood KaplanEason,  Ernest B, MD   ADMISSION DIAGNOSIS:  Dizziness [R42] Gait instability [R26.81] Generalized weakness [R53.1] Near syncope [R55] DISCHARGE DIAGNOSIS:  Active Problems:   Syncope  SECONDARY DIAGNOSIS:   Past Medical History:  Diagnosis Date  . Anxiety   . Arthritis   . Constipation   . Depression   . Diabetes mellitus without complication (HCC)    Type 2   . Dizziness   . Hyperlipemia   . Hypertension   . Mental disability   . Schizo affective schizophrenia [facility](HCC)    HOSPITAL COURSE:  This is a 50 year old black female who has a history of diabetes, hypertension, hyperlipidemia and schizophrenia who was admitted through the emergency department on 12/08/2015 with a diagnosis of fall, questionable syncopal episode. She was admitted with telemetry monitoring, echocardiogram, cardiology consultation were requested. Echo revealed normal systolic function with an estimated ejection fraction of 55-60%. Cardiology consultation recommended continue blood pressure control with Lasix for lower extremity edema. Patient's hospital course is uncomplicated. Her labs have been stable, clinically she has improved. She has been tolerating by mouth, has urine and stool output and has been ambulating without difficulty. DISCHARGE CONDITIONS:  Fall, near syncope Dizziness possibly secondary to medication Tardive dyskinesia Hypertension Diabetes type 2, insulin-dependent Hyperlipidemia Schizophrenia CONSULTS OBTAINED:  Treatment Team:  Lamar BlinksBruce J Kowalski, MD DRUG ALLERGIES:  No Known Allergies DISCHARGE MEDICATIONS:     Medication List    TAKE these medications   aspirin EC 81 MG tablet Take 81 mg by mouth  daily.   atorvastatin 10 MG tablet Commonly known as:  LIPITOR Take 10 mg by mouth daily.   benztropine 2 MG tablet Commonly known as:  COGENTIN Take 2 mg by mouth 2 (two) times daily.   colesevelam 625 MG tablet Commonly known as:  WELCHOL Take by mouth 3 (three) times daily with meals.   furosemide 40 MG tablet Commonly known as:  LASIX Take 1 tablet by mouth daily.   INGREZZA 40 MG Caps Generic drug:  Valbenazine Tosylate Take 2 tablets by mouth daily.   insulin glargine 100 UNIT/ML injection Commonly known as:  LANTUS Inject 35 Units into the skin at bedtime.   lisinopril-hydrochlorothiazide 10-12.5 MG tablet Commonly known as:  PRINZIDE,ZESTORETIC Take 1 tablet by mouth daily.   metoprolol succinate 100 MG 24 hr tablet Commonly known as:  TOPROL-XL Take 100 mg by mouth daily.   NOVOLOG MIX 70/30 FLEXPEN (70-30) 100 UNIT/ML FlexPen Generic drug:  insulin aspart protamine - aspart Inject 35 Units into the skin daily with breakfast.   potassium chloride SA 20 MEQ tablet Commonly known as:  K-DUR,KLOR-CON Take 1 tablet by mouth daily.   risperiDONE 3 MG tablet Commonly known as:  RISPERDAL Take 3 mg by mouth at bedtime.   TRULICITY 0.75 MG/0.5ML Sopn Generic drug:  Dulaglutide Inject 0.5 mLs into the skin once a week. mondays   venlafaxine 75 MG tablet Commonly known as:  EFFEXOR Take 75 mg by mouth 2 (two) times daily.        DISCHARGE INSTRUCTIONS:  Follow-up primary care provider and Dr. Gwen PoundsKowalski for cardiology in one week DIET:  Diabetic, cardiac diet DISCHARGE CONDITION:  Stable ACTIVITY:  Activity as tolerated  OXYGEN:  Home Oxygen: No.  Oxygen Delivery: room air DISCHARGE LOCATION:  group home   If you experience worsening of your admission symptoms, develop shortness of breath, life threatening emergency, suicidal or homicidal thoughts you must seek medical attention immediately by calling 911 or calling your MD immediately  if symptoms  less severe.  You Must read complete instructions/literature along with all the possible adverse reactions/side effects for all the Medicines you take and that have been prescribed to you. Take any new Medicines after you have completely understood and accpet all the possible adverse reactions/side effects.   Please note  You were cared for by a hospitalist during your hospital stay. If you have any questions about your discharge medications or the care you received while you were in the hospital after you are discharged, you can call the unit and asked to speak with the hospitalist on call if the hospitalist that took care of you is not available. Once you are discharged, your primary care physician will handle any further medical issues. Please note that NO REFILLS for any discharge medications will be authorized once you are discharged, as it is imperative that you return to your primary care physician (or establish a relationship with a primary care physician if you do not have one) for your aftercare needs so that they can reassess your need for medications and monitor your lab values.    On the day of Discharge:  VITAL SIGNS:  Blood pressure 131/77, pulse 93, temperature 98.5 F (36.9 C), temperature source Oral, resp. rate 18, height 5\' 2"  (1.575 m), weight 81.9 kg (180 lb 8 oz), SpO2 100 %. PHYSICAL EXAMINATION:  Vitalsreviewed. Constitutional: She is oriented to person, place, and time. She appears well-developedand well-nourished. No distress.  HENT:  Head: Normocephalicand atraumatic.  Mouth/Throat: Oropharynx is clear and moist.  Eyes: Conjunctivaeand EOMare normal. Pupils are equal, round, and reactive to light.  Neck: Normal range of motion. Neck supple. No JVDpresent. No tracheal deviationpresent. No thyromegalypresent.  Cardiovascular: Normal rate, regular rhythmand normal heart sounds. Exam reveals no gallopand no friction rub.  No murmurheard. Respiratory:  Effort normaland breath sounds normal.  GI: Soft. Bowel sounds are normal. She exhibits no distension. There is no tenderness.  Musculoskeletal: Normal range of motion. She exhibits no edema.  Lymphadenopathy:  She has no cervical adenopathy.  Neurological: She is alertand oriented to person, place, and time. No cranial nerve deficit. She exhibits normal muscle tone.  Skin: Skin is warmand dry. No rashnoted. No erythema.  Psychiatric: She has a normal mood and affect. Her behavior is normal. Judgmentand thought contentnormal.  DATA REVIEW:   CBC  Recent Labs Lab 12/09/15 0345  WBC 5.4  HGB 12.0  HCT 33.8*  PLT 125*    Chemistries   Recent Labs Lab 12/07/15 2336  12/10/15 0417  NA 131*  < > 140  K 3.8  < > 3.9  CL 95*  < > 110  CO2 30  < > 25  GLUCOSE 148*  < > 141*  BUN 8  < > 9  CREATININE 1.15*  < > 1.11*  CALCIUM 9.4  < > 8.8*  AST 35  --   --   ALT 29  --   --   ALKPHOS 103  --   --   BILITOT 0.9  --   --   < > = values in this interval not displayed.   Microbiology Results  Results for orders placed or performed during  the hospital encounter of 12/07/15  MRSA PCR Screening     Status: None   Collection Time: 12/08/15  4:19 AM  Result Value Ref Range Status   MRSA by PCR NEGATIVE NEGATIVE Final    Comment:        The GeneXpert MRSA Assay (FDA approved for NASAL specimens only), is one component of a comprehensive MRSA colonization surveillance program. It is not intended to diagnose MRSA infection nor to guide or monitor treatment for MRSA infections.     RADIOLOGY:  No results found.   Management plans discussed with the patient, family and they are in agreement.  CODE STATUS:     Code Status Orders        Start     Ordered   12/08/15 0414  Full code  Continuous     12/08/15 0413    Code Status History    Date Active Date Inactive Code Status Order ID Comments User Context   08/02/2014  5:29 PM 08/05/2014 11:00 PM Full Code  [id number]  Lisbeth Renshaw, MD Inpatient      TOTAL TIME TAKING CARE OF THIS PATIENT: 40 minutes.    Tonye Royalty M.D on 12/10/2015 at 10:14 AM  Between 7am to 6pm - Pager - [phone]  After 6pm go to [email or web address] - Social research officer, government  [facility] [place] [facility]  Office  [phone]  CC: Primary care physician; Derwood Kaplan, MD   Note: This dictation was prepared with Dragon dictation along with smaller phrase technology. Any transcriptional errors that result from this process are unintentional.

## 2015-12-10 NOTE — Clinical Social Work Note (Signed)
Clinical Social Work Assessment  Patient Details  Name: Willaim Baneatricia A Foster MRN: [id number] Date of Birth: 01/29/1965  Date of referral:  12/10/15               Reason for consult:  Facility Placement                Permission sought to share information with:  Facility Industrial/product designerContact Representative Permission granted to share information::  Yes, Verbal Permission Granted  Name::        Agency::     Relationship::     Contact Information:     Housing/Transportation Living arrangements for the past 2 months:   (Family Care Home) Source of Information:  Facility Patient Interpreter Needed:  None Criminal Activity/Legal Involvement Pertinent to Current Situation/Hospitalization:  No - Comment as needed Significant Relationships:  None Lives with:  Facility Resident Do you feel safe going back to the place where you live?  Yes Need for family participation in patient care:  Yes (Comment)  Care giving concerns:  [facility]   Social Worker assessment / plan: The CSW contacted the family care home administrator Reece LeaderBarbara Mebane. At first, Ms. Mebane reported that "I can't take her back. She doesn't walk. I would lose my license". The CSW advised Ms. Mebane that the patient can ambulate and PT is recommending HHPT. Ms. Dan HumphreysMebane indicated that she cannot lift her. The CSW advised again that the patient can ambulate, and that a walker would be ordered for her. Ms. Dan HumphreysMebane reported that she would arrive at the hospital @5pm  [facility]   Be advised, if Ms. Mebane refuses to transport the patient, alert the CSW on Monday to contact DSS to make a report per Zack.   Employment status:  Disabled (Comment on whether or not currently receiving Disability) Insurance information:  Medicare PT Recommendations:  Home with Home Health Information / Referral to community resources:     Patient/Family's Response to care:  Facility representative indicated that she may  refuse service.  Patient/Family's Understanding of and Emotional Response to Diagnosis, Current Treatment, and Prognosis:  The facility representative indicated that she may refuse service even though the patient is able to ambulate with DME assistance. Emotional Assessment Appearance:  Appears stated age Attitude/Demeanor/Rapport:   (All information from facility) Affect (typically observed):   (All information from facility) Orientation:  Oriented to Self, Oriented to Place Alcohol / Substance use:  Never Used Psych involvement (Current and /or in the community):  Yes (Comment)  Discharge Needs  Concerns to be addressed:  Discharge Planning Concerns Readmission within the last 30 days:  No Current discharge risk:  Physical Impairment Barriers to Discharge:  No Barriers Identified   Judi CongKaren M Cathrine Krizan, LCSW 12/10/2015, 11:56 AM

## 2015-12-10 NOTE — Progress Notes (Signed)
DISCHARGE NOTE:  Pts caregiver is here at the bedside to pick pt up. Discharge enevolope given to caregiver. Pt wheeled to car by staff.

## 2016-10-28 ENCOUNTER — Other Ambulatory Visit: Payer: Self-pay | Admitting: Internal Medicine

## 2016-10-28 DIAGNOSIS — Z1231 Encounter for screening mammogram for malignant neoplasm of breast: Secondary | ICD-10-CM

## 2016-12-10 ENCOUNTER — Ambulatory Visit
Admission: RE | Admit: 2016-12-10 | Discharge: 2016-12-10 | Disposition: A | Discharge status: Home / Self Care | Payer: Medicare Other | Source: Ambulatory Visit | Attending: Internal Medicine | Admitting: Internal Medicine

## 2016-12-10 DIAGNOSIS — Z1231 Encounter for screening mammogram for malignant neoplasm of breast: Secondary | ICD-10-CM | POA: Insufficient documentation

## 2017-01-18 ENCOUNTER — Emergency Department
Admission: EM | Admit: 2017-01-18 | Discharge: 2017-01-18 | Disposition: A | Discharge status: Home / Self Care | Payer: Medicare Other | Attending: Emergency Medicine | Admitting: Emergency Medicine

## 2017-01-18 ENCOUNTER — Emergency Department: Payer: Medicare Other

## 2017-01-18 ENCOUNTER — Other Ambulatory Visit: Payer: Self-pay

## 2017-01-18 DIAGNOSIS — Z7982 Long term (current) use of aspirin: Secondary | ICD-10-CM | POA: Insufficient documentation

## 2017-01-18 DIAGNOSIS — Z79899 Other long term (current) drug therapy: Secondary | ICD-10-CM | POA: Insufficient documentation

## 2017-01-18 DIAGNOSIS — Z794 Long term (current) use of insulin: Secondary | ICD-10-CM | POA: Insufficient documentation

## 2017-01-18 DIAGNOSIS — E119 Type 2 diabetes mellitus without complications: Secondary | ICD-10-CM | POA: Insufficient documentation

## 2017-01-18 DIAGNOSIS — I10 Essential (primary) hypertension: Secondary | ICD-10-CM | POA: Insufficient documentation

## 2017-01-18 DIAGNOSIS — K5900 Constipation, unspecified: Secondary | ICD-10-CM | POA: Insufficient documentation

## 2017-01-18 LAB — COMPREHENSIVE METABOLIC PANEL
ALBUMIN: 4.1 g/dL (ref 3.5–5.0)
ALT: 26 U/L (ref 14–54)
AST: 21 U/L (ref 15–41)
Alkaline Phosphatase: 110 U/L (ref 38–126)
Anion gap: 5 (ref 5–15)
BUN: 15 mg/dL (ref 6–20)
CHLORIDE: 99 mmol/L — AB (ref 101–111)
CO2: 32 mmol/L (ref 22–32)
CREATININE: 1.25 mg/dL — AB (ref 0.44–1.00)
Calcium: 9.5 mg/dL (ref 8.9–10.3)
GFR calc Af Amer: 57 mL/min — ABNORMAL LOW (ref >60)
GFR, EST NON AFRICAN AMERICAN: 49 mL/min — AB (ref >60)
GLUCOSE: 67 mg/dL (ref 65–99)
POTASSIUM: 3.9 mmol/L (ref 3.5–5.1)
SODIUM: 136 mmol/L (ref 135–145)
Total Bilirubin: 0.7 mg/dL (ref 0.3–1.2)
Total Protein: 7.8 g/dL (ref 6.5–8.1)

## 2017-01-18 LAB — CBC
HEMATOCRIT: 38.2 % (ref 35.0–47.0)
Hemoglobin: 13.1 g/dL (ref 12.0–16.0)
MCH: 31.1 pg (ref 26.0–34.0)
MCHC: 34.4 g/dL (ref 32.0–36.0)
MCV: 90.5 fL (ref 80.0–100.0)
PLATELETS: 139 10*3/uL — AB (ref 150–440)
RBC: 4.23 MIL/uL (ref 3.80–5.20)
RDW: 13.1 % (ref 11.5–14.5)
WBC: 7 10*3/uL (ref 3.6–11.0)

## 2017-01-18 LAB — LIPASE, BLOOD: LIPASE: 20 U/L (ref 11–51)

## 2017-01-18 MED ORDER — MINERAL OIL RE ENEM
1.0000 | ENEMA | Freq: Once | RECTAL | Status: AC
Start: 2017-01-18 — End: 2017-01-18
  Administered 2017-01-18: 1 via RECTAL

## 2017-01-18 NOTE — ED Notes (Signed)
MD aware of not IV access. Rn will continue to monitor.

## 2017-01-18 NOTE — ED Notes (Signed)
On 01/13/17 and 01/14/17 caregiver gave pt miralax. Pt then had small BM. Yesterday 01/17/17 caregiver gave her Magnesium citrate pt has not had BM yet.

## 2017-01-18 NOTE — ED Notes (Signed)
Pt presents today with constipation. Pt and caregiver state she has not had a BM since 01/14/17. Pt complains of abdominal pain. Pt is A/O x4 with caregiver at bedside awaiting EDP.

## 2017-01-18 NOTE — ED Provider Notes (Addendum)
[facility] Emergency Department Provider Note  ____________________________________________   I have reviewed the triage vital signs and the nursing notes. Where available I have reviewed prior notes and, if possible and indicated, outside hospital notes.    HISTORY  Chief Complaint Constipation    HPI Kristina Foster is a 51 y.o. female who presents today complaining of constipation.  Has been here for constipation this is a lifelong problem apparently.  She is here with her caretaker.  Patient herself states she has some discomfort in her abdomen from this.  She has not had a bowel movement since the day before "Christmas".  Today is the 29th.  The patient states she had some nausea on Christmas day but has not vomited since then.  Has been eating but decreased.  Feels like prior constipation.  At her baseline per caretaker, level 5 chart caveat; no further history available due to patient status.   Location: Diffuse Radiation: None Quality: Crampy is what the patient endorses Duration: For 5 days Timing: See above Severity: Mild to moderate Associated sxs: Was nauseated several days ago PriorTreatment : Patient states she has tried MiraLAX and other laxatives at home with no success   Past Medical History:  Diagnosis Date  . Anxiety   . Arthritis   . Constipation   . Depression   . Diabetes mellitus without complication (HCC)    Type 2   . Dizziness   . Hyperlipemia   . Hypertension   . Mental disability   . Schizo affective schizophrenia [facility](HCC)     Patient Active Problem List   Diagnosis Date Noted  . Syncope 12/08/2015  . Lumbar spondylosis 08/02/2014    Past Surgical History:  Procedure Laterality Date  . ABDOMINAL HYSTERECTOMY    . BACK SURGERY      Prior to Admission medications   Medication Sig Start Date End Date Taking? Authorizing Provider  aspirin EC 81 MG tablet Take 81 mg by mouth daily.    [provider]   atorvastatin (LIPITOR) 10 MG tablet Take 10 mg by mouth daily.    [provider]  benztropine (COGENTIN) 2 MG tablet Take 2 mg by mouth 2 (two) times daily.    [provider]  colesevelam (WELCHOL) 625 MG tablet Take by mouth 3 (three) times daily with meals.     [provider]  furosemide (LASIX) 40 MG tablet Take 1 tablet by mouth daily. 11/08/15   [provider]  INGREZZA 40 MG CAPS Take 2 tablets by mouth daily. 11/09/15   [provider]  insulin glargine (LANTUS) 100 UNIT/ML injection Inject 35 Units into the skin at bedtime.     [provider]  lisinopril-hydrochlorothiazide (PRINZIDE,ZESTORETIC) 10-12.5 MG per tablet Take 1 tablet by mouth daily.    [provider]  metoprolol succinate (TOPROL-XL) 100 MG 24 hr tablet Take 100 mg by mouth daily.    [provider]  NOVOLOG MIX 70/30 FLEXPEN (70-30) 100 UNIT/ML FlexPen Inject 35 Units into the skin daily with breakfast.  11/15/15   [provider]  potassium chloride SA (K-DUR,KLOR-CON) 20 MEQ tablet Take 1 tablet by mouth daily. 11/08/15   [provider]  risperiDONE (RISPERDAL) 3 MG tablet Take 3 mg by mouth at bedtime.     [provider]  TRULICITY 0.75 MG/0.5ML SOPN Inject 0.5 mLs into the skin once a week. mondays 11/10/15   [provider]  venlafaxine (EFFEXOR) 75 MG tablet Take 75  mg by mouth 2 (two) times daily.    [provider]    Allergies Patient has no known allergies.  Family History  Problem Relation Age of Onset  . Breast cancer Neg Hx     Social History Social History   Tobacco Use  . Smoking status: Never Smoker  . Smokeless tobacco: Never Used  Substance Use Topics  . Alcohol use: No  . Drug use: No    Review of Systems Constitutional: No fever/chills Eyes: No visual changes. ENT: No sore throat. No stiff neck no neck pain Cardiovascular: Denies chest pain. Respiratory: Denies  shortness of breath. Gastrointestinal:   Nausea and "sped up" 4 days ago .  No diarrhea.  Positive constipation. Genitourinary: Negative for dysuria. Musculoskeletal: Negative lower extremity swelling Skin: Negative for rash. Neurological: Negative for severe headaches, focal weakness or numbness.   ____________________________________________   PHYSICAL EXAM:  VITAL SIGNS: ED Triage Vitals  Enc Vitals Group     BP 01/18/17 1142 100/65     Pulse Rate 01/18/17 1142 74     Resp 01/18/17 1142 18     Temp 01/18/17 1142 98.4 F (36.9 C)     Temp Source 01/18/17 1142 Oral     SpO2 01/18/17 1142 100 %     Weight 01/18/17 1143 178 lb (80.7 kg)     Height 01/18/17 1143 5\' 2"  (1.575 m)     Head Circumference --      Peak Flow --      Pain Score 01/18/17 1142 9     Pain Loc --      Pain Edu? --      Excl. in GC? --     Constitutional: Alert and oriented. Well appearing and in no acute distress. Eyes: Conjunctivae are normal Head: Atraumatic HEENT: No congestion/rhinnorhea. Mucous membranes are moist.  Oropharynx non-erythematous Neck:   Nontender with no meningismus, no masses, no stridor Cardiovascular: Normal rate, regular rhythm. Grossly normal heart sounds.  Good peripheral circulation. Respiratory: Normal respiratory effort.  No retractions. Lungs CTAB. Abdominal: Soft and slight discomfort noted diffusely. No distention. No guarding no rebound Back:  There is no focal tenderness or step off.  there is no midline tenderness there are no lesions noted. there is no CVA tenderness Musculoskeletal: No lower extremity tenderness, no upper extremity tenderness. No joint effusions, no DVT signs strong distal pulses no edema Neurologic:  Normal speech and language. No gross focal neurologic deficits are appreciated.  Skin:  Skin is warm, dry and intact. No rash noted. Psychiatric: Mood and affect are normal. Speech and behavior are  normal.  ____________________________________________   LABS (all labs ordered are listed, but only abnormal results are displayed)  Labs Reviewed  COMPREHENSIVE METABOLIC PANEL - Abnormal; Notable for the following components:      Result Value   Chloride 99 (*)    Creatinine, Ser 1.25 (*)    GFR calc non Af Amer 49 (*)    GFR calc Af Amer 57 (*)    All other components within normal limits  CBC - Abnormal; Notable for the following components:   Platelets 139 (*)    All other components within normal limits  LIPASE, BLOOD    Pertinent labs  results that were available during my care of the patient were reviewed by me and considered in my medical decision making (see chart for details). ____________________________________________  EKG  I personally interpreted any EKGs ordered by me or triage  ____________________________________________  RADIOLOGY  Pertinent labs & imaging results that were available during my care of the patient were reviewed by me and considered in my medical decision making (see chart for details). If possible, patient and/or family made aware of any abnormal findings.  No results found. ____________________________________________    PROCEDURES  Procedure(s) performed: None  Procedures  Critical Care performed: None  ____________________________________________   INITIAL IMPRESSION / ASSESSMENT AND PLAN / ED COURSE  Pertinent labs & imaging results that were available during my care of the patient were reviewed by me and considered in my medical decision making (see chart for details).  Patient with lower abdominal discomfort and constipation, we will obtain x-ray to make sure nothing else is going on abdomen is nonsurgical, reluctant to CT scan and this significant abnormality found on x-ray given exam and his history, blood work reassuring.  ----------------------------------------- 6:00 PM on  01/18/2017 -----------------------------------------  After enema patient had a very large  bowel movement feels much better, eager to go home    ____________________________________________   FINAL CLINICAL IMPRESSION(S) / ED DIAGNOSES  Final diagnoses:  None      This chart was dictated using voice recognition software.  Despite best efforts to proofread,  errors can occur which can change meaning.      Jeanmarie Plant, MD 01/18/17 9604    Jeanmarie Plant, MD 01/18/17 [phone]

## 2017-01-18 NOTE — ED Triage Notes (Signed)
Pt arrives to ED via POV from home with c/o constipation x1 week. Pt reports last BM was Christmas Eve. Pt reports using OTC medications and stool softeners without relief. Pt reports 9/10 abdominal pain. Pt is A&O, in NAD; RR even, regular, and unlabored.

## 2017-07-16 IMAGING — CT CT RENAL STONE PROTOCOL
2 of 4 series · 16 of 46 positions shown, 18 images · non-contrast
Comparison: None.

CLINICAL DATA: Lower abdominal pain with nausea and vomiting for 3
days.

EXAM:
CT ABDOMEN AND PELVIS WITHOUT CONTRAST
TECHNIQUE: Multidetector CT imaging of the abdomen and pelvis was performed
following the standard protocol without IV contrast.

[Series 2: axial st · axial · 0.71mm/px · z∈[-731,-271]mm · 13 of 100 slices shown, 15 images]
[im 4/100  soft-tissue]
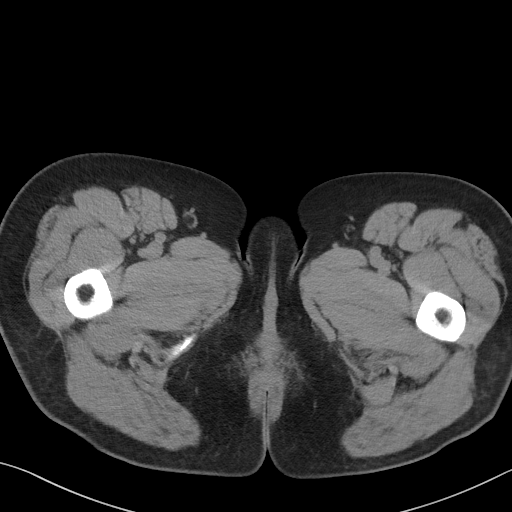
[im 4/100  bone]
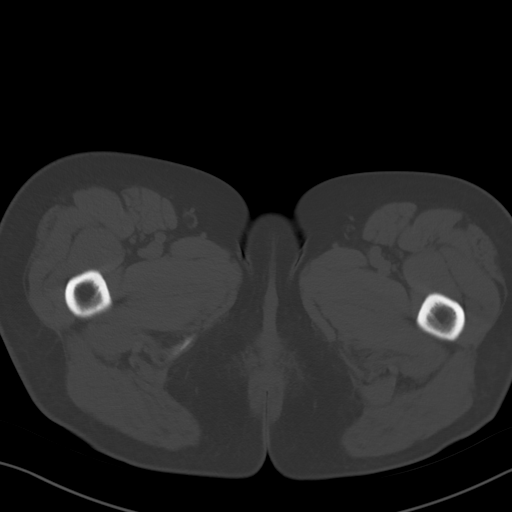
[im 12/100  soft-tissue]
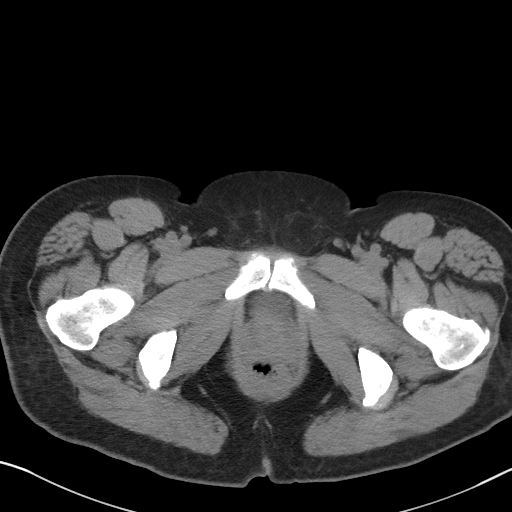
[im 20/100  soft-tissue]
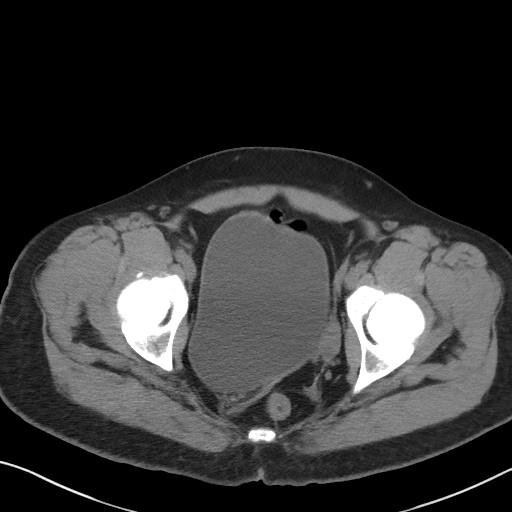
[im 27/100  soft-tissue]
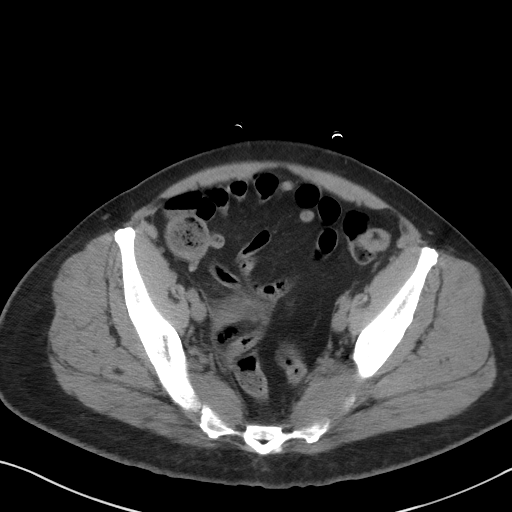
[im 35/100  soft-tissue]
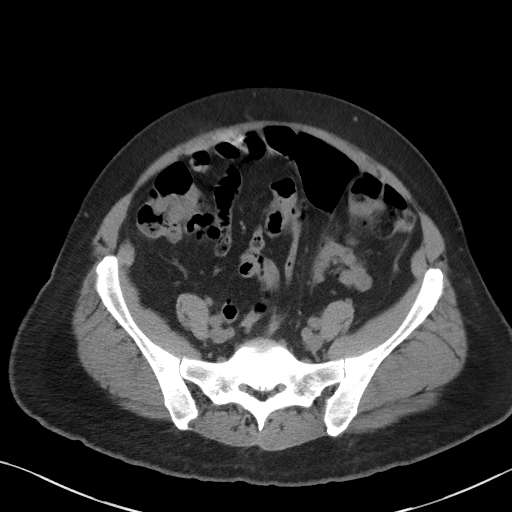
[im 42/100  soft-tissue]
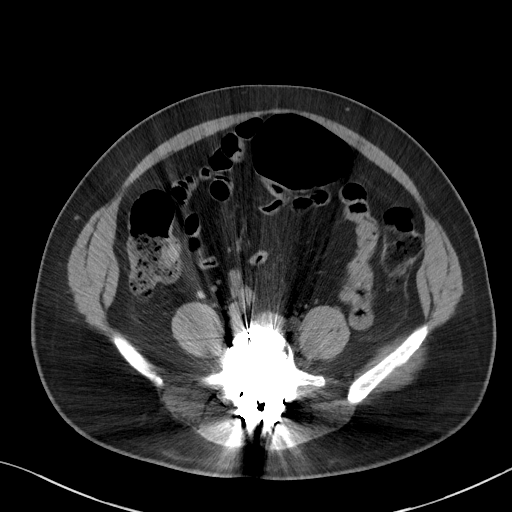
[im 50/100  soft-tissue]
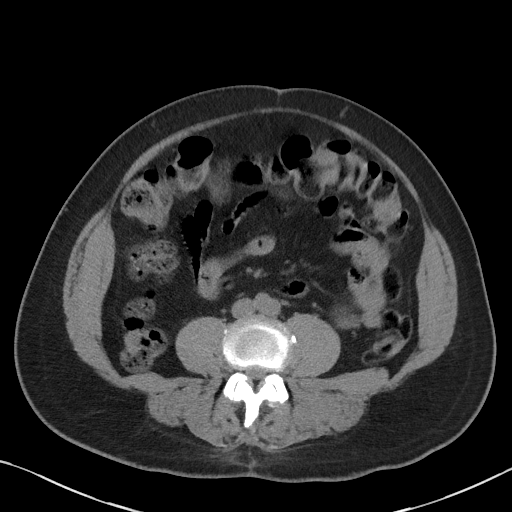
[im 58/100  soft-tissue]
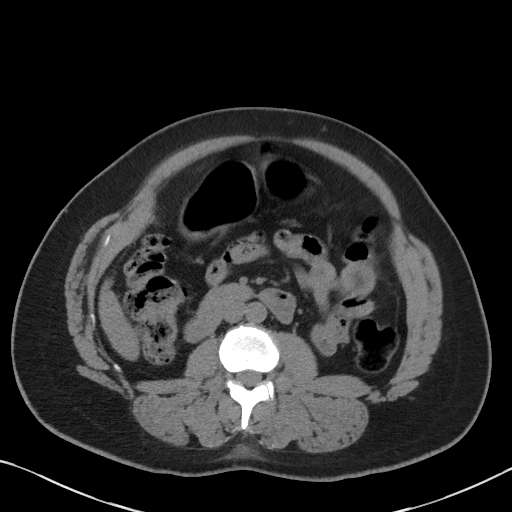
[im 65/100  soft-tissue]
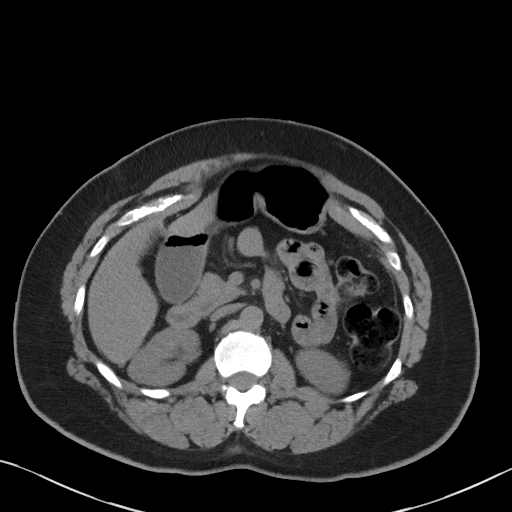
[im 65/100  bone]
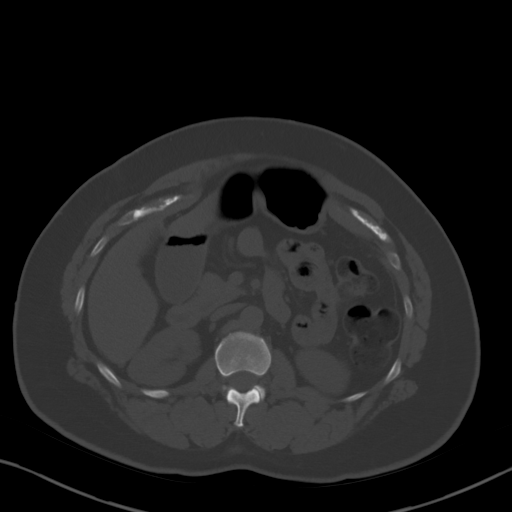
[im 73/100  soft-tissue]
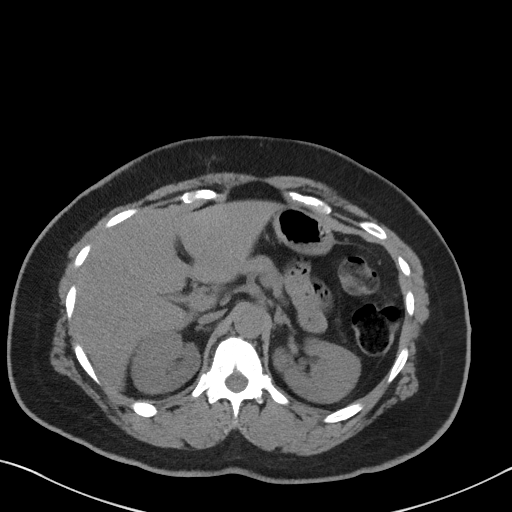
[im 80/100  soft-tissue]
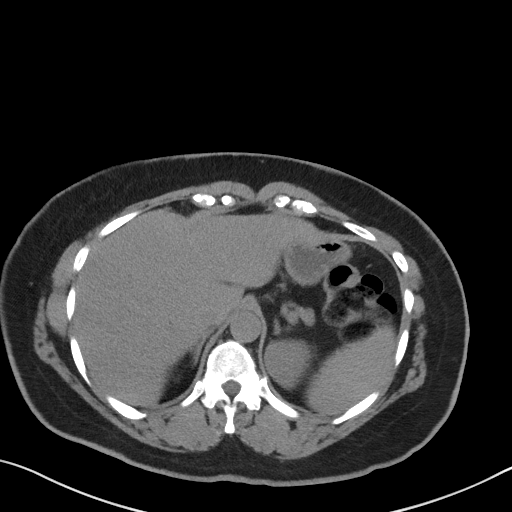
[im 88/100  soft-tissue]
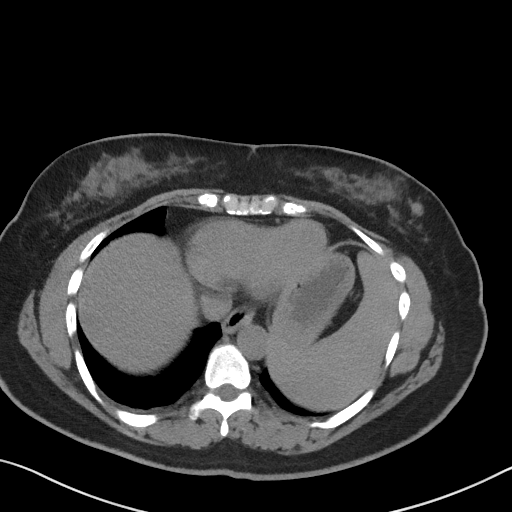
[im 96/100  soft-tissue]
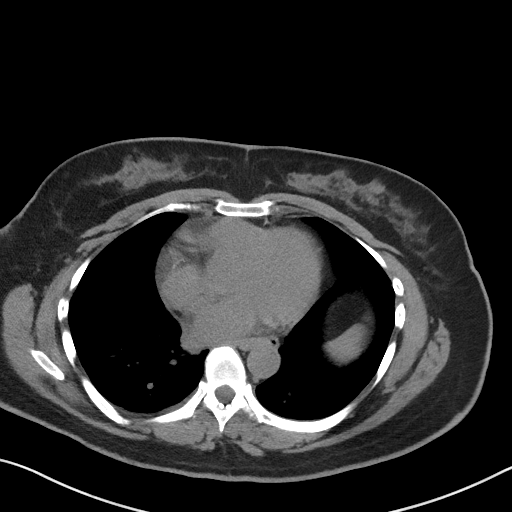

[Series 5: coronal · coronal · 0.71mm/px · 3 of 134 slices shown]
[im 45/134  soft-tissue]
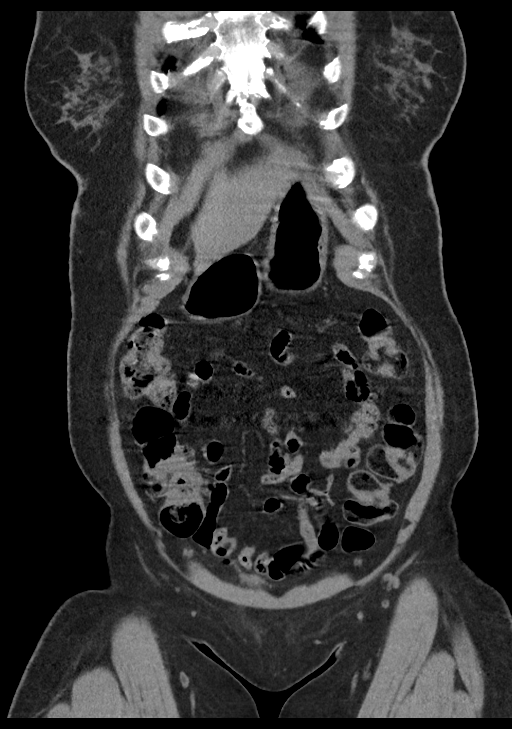
[im 60/134  soft-tissue]
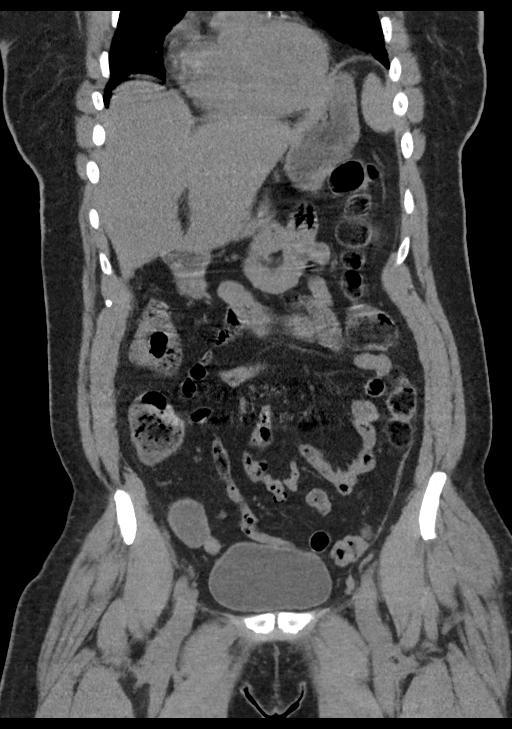
[im 74/134  soft-tissue]
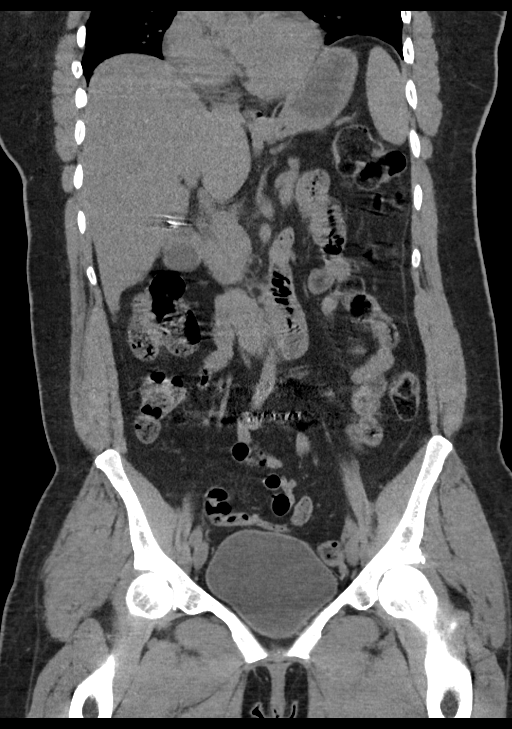

[16 of 46 positions shown; findings below may reference images not displayed]

FINDINGS: Lower chest: No acute findings. Coronary artery calcification noted.

Hepatobiliary: No mass visualized on this unenhanced exam. Prior
cholecystectomy noted. No evidence of biliary dilatation.

Pancreas: No mass or inflammatory process visualized on this
unenhanced exam.

Spleen:  Within normal limits in size.

Adrenals/Urinary tract: No evidence of urolithiasis or
hydronephrosis. Unremarkable appearance of bladder.

Stomach/Bowel: No evidence of obstruction, inflammatory process, or
abnormal fluid collections. Moderate to large colonic stool burden
noted.

Vascular/Lymphatic: No pathologically enlarged lymph nodes
identified. No evidence of abdominal aortic aneurysm. Aortic
atherosclerosis.

Reproductive: Prior hysterectomy noted. Adnexal regions are
unremarkable in appearance.

Other:  None.

Musculoskeletal: No suspicious bone lesions identified. PLIF
hardware at L4-5.
IMPRESSION: No acute findings.

Moderate to large stool burden noted; suggest clinical correlation
for possible constipation.

Aortic and coronary artery atherosclerosis.

## 2017-07-26 ENCOUNTER — Emergency Department
Admission: EM | Admit: 2017-07-26 | Discharge: 2017-07-26 | Disposition: A | Discharge status: Home / Self Care | Payer: Medicare Other | Attending: Emergency Medicine | Admitting: Emergency Medicine

## 2017-07-26 ENCOUNTER — Emergency Department: Payer: Medicare Other

## 2017-07-26 ENCOUNTER — Other Ambulatory Visit: Payer: Self-pay

## 2017-07-26 DIAGNOSIS — Y9301 Activity, walking, marching and hiking: Secondary | ICD-10-CM | POA: Diagnosis not present

## 2017-07-26 DIAGNOSIS — Y999 Unspecified external cause status: Secondary | ICD-10-CM | POA: Insufficient documentation

## 2017-07-26 DIAGNOSIS — E119 Type 2 diabetes mellitus without complications: Secondary | ICD-10-CM | POA: Diagnosis not present

## 2017-07-26 DIAGNOSIS — S93492A Sprain of other ligament of left ankle, initial encounter: Secondary | ICD-10-CM | POA: Diagnosis not present

## 2017-07-26 DIAGNOSIS — Z79899 Other long term (current) drug therapy: Secondary | ICD-10-CM | POA: Insufficient documentation

## 2017-07-26 DIAGNOSIS — Z7982 Long term (current) use of aspirin: Secondary | ICD-10-CM | POA: Diagnosis not present

## 2017-07-26 DIAGNOSIS — S99912A Unspecified injury of left ankle, initial encounter: Secondary | ICD-10-CM | POA: Diagnosis present

## 2017-07-26 DIAGNOSIS — I10 Essential (primary) hypertension: Secondary | ICD-10-CM | POA: Insufficient documentation

## 2017-07-26 DIAGNOSIS — Y92239 Unspecified place in hospital as the place of occurrence of the external cause: Secondary | ICD-10-CM | POA: Insufficient documentation

## 2017-07-26 DIAGNOSIS — X509XXA Other and unspecified overexertion or strenuous movements or postures, initial encounter: Secondary | ICD-10-CM | POA: Diagnosis not present

## 2017-07-26 DIAGNOSIS — Z794 Long term (current) use of insulin: Secondary | ICD-10-CM | POA: Diagnosis not present

## 2017-07-26 MED ORDER — MELOXICAM 15 MG PO TABS: 15.0000 mg | ORAL_TABLET | Freq: Every day | ORAL | 1 refills | Status: AC

## 2017-07-26 NOTE — ED Provider Notes (Signed)
[facility] Emergency Department Provider Note  ____________________________________________  Time seen: Approximately 9:08 PM  I have reviewed the triage vital signs and the nursing notes.   HISTORY  Chief Complaint Ankle Pain    HPI Kristina Foster is a 52 y.o. female presents to the emergency department with aching and non-radiating left ankle pain after patient sustained an inversion type ankle injury while out walking in [place] today .  Patient has never had a left ankle sprain in the past.  She denies weakness or numbness and tingling in the left lower extremity.  Patient did not hit her head during fall.  No alleviating measures have been attempted.   Past Medical History:  Diagnosis Date  . Anxiety   . Arthritis   . Constipation   . Depression   . Diabetes mellitus without complication (HCC)    Type 2   . Dizziness   . Hyperlipemia   . Hypertension   . Mental disability   . Schizo affective schizophrenia [facility])     Patient Active Problem List   Diagnosis Date Noted  . Syncope 12/08/2015  . Lumbar spondylosis 08/02/2014    Past Surgical History:  Procedure Laterality Date  . ABDOMINAL HYSTERECTOMY    . BACK SURGERY      Prior to Admission medications   Medication Sig Start Date End Date Taking? Authorizing Provider  aspirin EC 81 MG tablet Take 81 mg by mouth daily.    [provider]  atorvastatin (LIPITOR) 10 MG tablet Take 10 mg by mouth daily.    [provider]  benztropine (COGENTIN) 2 MG tablet Take 2 mg by mouth 2 (two) times daily.    [provider]  colesevelam (WELCHOL) 625 MG tablet Take by mouth 3 (three) times daily with meals.     [provider]  furosemide (LASIX) 40 MG tablet Take 1 tablet by mouth daily. 11/08/15   [provider]  INGREZZA 40 MG CAPS Take 2 tablets by mouth daily. 11/09/15   [provider]  insulin glargine (LANTUS) 100 UNIT/ML injection  Inject 35 Units into the skin at bedtime.     [provider]  lisinopril-hydrochlorothiazide (PRINZIDE,ZESTORETIC) 10-12.5 MG per tablet Take 1 tablet by mouth daily.    [provider]  meloxicam (MOBIC) 15 MG tablet Take 1 tablet (15 mg total) by mouth daily for 7 days. 07/26/17 08/02/17  Orvil Feil, PA-C  metoprolol succinate (TOPROL-XL) 100 MG 24 hr tablet Take 100 mg by mouth daily.    [provider]  NOVOLOG MIX 70/30 FLEXPEN (70-30) 100 UNIT/ML FlexPen Inject 35 Units into the skin daily with breakfast.  11/15/15   [provider]  potassium chloride SA (K-DUR,KLOR-CON) 20 MEQ tablet Take 1 tablet by mouth daily. 11/08/15   [provider]  risperiDONE (RISPERDAL) 3 MG tablet Take 3 mg by mouth at bedtime.     [provider]  TRULICITY 0.75 MG/0.5ML SOPN Inject 0.5 mLs into the skin once a week. mondays 11/10/15   [provider]  venlafaxine (EFFEXOR) 75 MG tablet Take 75 mg by mouth 2 (two) times daily.    [provider]    Allergies Patient has no known allergies.  Family History  Problem Relation Age of Onset  . Breast cancer Neg Hx     Social History Social History   Tobacco Use  . Smoking status: Never Smoker  . Smokeless tobacco: Never Used  Substance Use Topics  .  Alcohol use: No  . Drug use: No     Review of Systems  Constitutional: No fever/chills Eyes: No visual changes. No discharge ENT: No upper respiratory complaints. Cardiovascular: no chest pain. Respiratory: no cough. No SOB. Gastrointestinal: No abdominal pain.  No nausea, no vomiting.  No diarrhea.  No constipation. Genitourinary: Negative for dysuria. No hematuria Musculoskeletal: Patient has left ankle sprain.  Skin: Negative for rash, abrasions, lacerations, ecchymosis. Neurological: Negative for headaches, focal weakness or numbness.  ____________________________________________   PHYSICAL EXAM:  VITAL SIGNS: ED  Triage Vitals [07/26/17 1956]  Enc Vitals Group     BP 116/70     Pulse Rate 74     Resp 16     Temp 99.1 F (37.3 C)     Temp Source Oral     SpO2 99 %     Weight 186 lb (84.4 kg)     Height 5\' 2"  (1.575 m)     Head Circumference      Peak Flow      Pain Score 8     Pain Loc      Pain Edu?      Excl. in GC?      Constitutional: Alert and oriented. Well appearing and in no acute distress. Eyes: Conjunctivae are normal. PERRL. EOMI. Head: Atraumatic. Cardiovascular: Normal rate, regular rhythm. Normal S1 and S2.  Good peripheral circulation. Respiratory: Normal respiratory effort without tachypnea or retractions. Lungs CTAB. Good air entry to the bases with no decreased or absent breath sounds. Musculoskeletal: Full range of motion to all extremities.  Patient has tenderness over the anterior and posterior talofibular ligaments but not the deltoid ligament.  Palpable dorsalis pedis pulse, left. Neurologic:  Normal speech and language. No gross focal neurologic deficits are appreciated.  Skin:  Skin is warm, dry and intact. No rash noted. Psychiatric: Mood and affect are normal. Speech and behavior are normal. Patient exhibits appropriate insight and judgement.   ____________________________________________   LABS (all labs ordered are listed, but only abnormal results are displayed)  Labs Reviewed - No data to display ____________________________________________  EKG   ____________________________________________  RADIOLOGY I personally viewed and evaluated these images as part of my medical decision making, as well as reviewing the written report by the radiologist.  Dg Ankle Complete Left  Result Date: 07/26/2017 CLINICAL DATA:  Tripped over a hose in her yard today, lateral LEFT ankle pain EXAM: LEFT ANKLE COMPLETE - 3+ VIEW COMPARISON:  None FINDINGS: Diffuse soft tissue swelling LEFT lower leg and ankle. Osseous mineralization normal. Ankle joint space preserved.  No acute fracture, dislocation or bone destruction. IMPRESSION: Soft tissue swelling without acute bony abnormalities. Electronically Signed   By: Ulyses Southward M.D.   On: 07/26/2017 20:25    ____________________________________________    PROCEDURES  Procedure(s) performed:    Procedures    Medications - No data to display   ____________________________________________   INITIAL IMPRESSION / ASSESSMENT AND PLAN / ED COURSE  Pertinent labs & imaging results that were available during my care of the patient were reviewed by me and considered in my medical decision making (see chart for details).  Review of the [place] CSRS was performed in accordance of the [facility] prior to dispensing any controlled drugs.      Assessment and plan Left ankle sprain Patient presents to the emergency department with a left ankle pain after sustaining an inversion type ankle injury while walking earlier today in [place].  X-ray examination reveals no  acute fractures or bony abnormalities.  Patient had tenderness over the anterior and posterior talofibular ligaments on physical exam, consistent with left ankle sprain.  Patient was discharged with meloxicam after she assured me that she tolerates anti-inflammatories and does not have a history of GI bleeds.  She was referred to podiatry.  All patient questions were answered.    ____________________________________________  FINAL CLINICAL IMPRESSION(S) / ED DIAGNOSES  Final diagnoses:  Sprain of anterior talofibular ligament of left ankle, initial encounter      NEW MEDICATIONS STARTED DURING THIS VISIT:  ED Discharge Orders        Ordered    meloxicam (MOBIC) 15 MG tablet  Daily     07/26/17 2104          This chart was dictated using voice recognition software/Dragon. Despite best efforts to proofread, errors can occur which can change the meaning. Any change was purely unintentional.    Orvil FeilWoods, Shalini Mair M, PA-C 07/26/17 2116     Emily FilbertWilliams, Jonathan E, MD 07/26/17 [phone]

## 2017-07-26 NOTE — ED Triage Notes (Signed)
Reports twisted left ankle this morning and having continued pain.

## 2017-07-26 NOTE — ED Notes (Signed)
Pt twisted ankle today. Soaked it in [employer] salts but did not take any medications for pain or swelling. Slight swelling to left ankle without bruising noted.

## 2017-07-26 NOTE — ED Notes (Signed)
Pt to xray via wheelchair from flex waiting room

## 2017-07-28 IMAGING — CR DG CHEST 1V PORT
1 series · 1 of 1 positions shown · non-contrast
Comparison: Chest radiograph performed 07/12/2006

CLINICAL DATA: Acute onset of confusion and dizziness. Initial
encounter.

EXAM:
PORTABLE CHEST 1 VIEW

[chest ap]
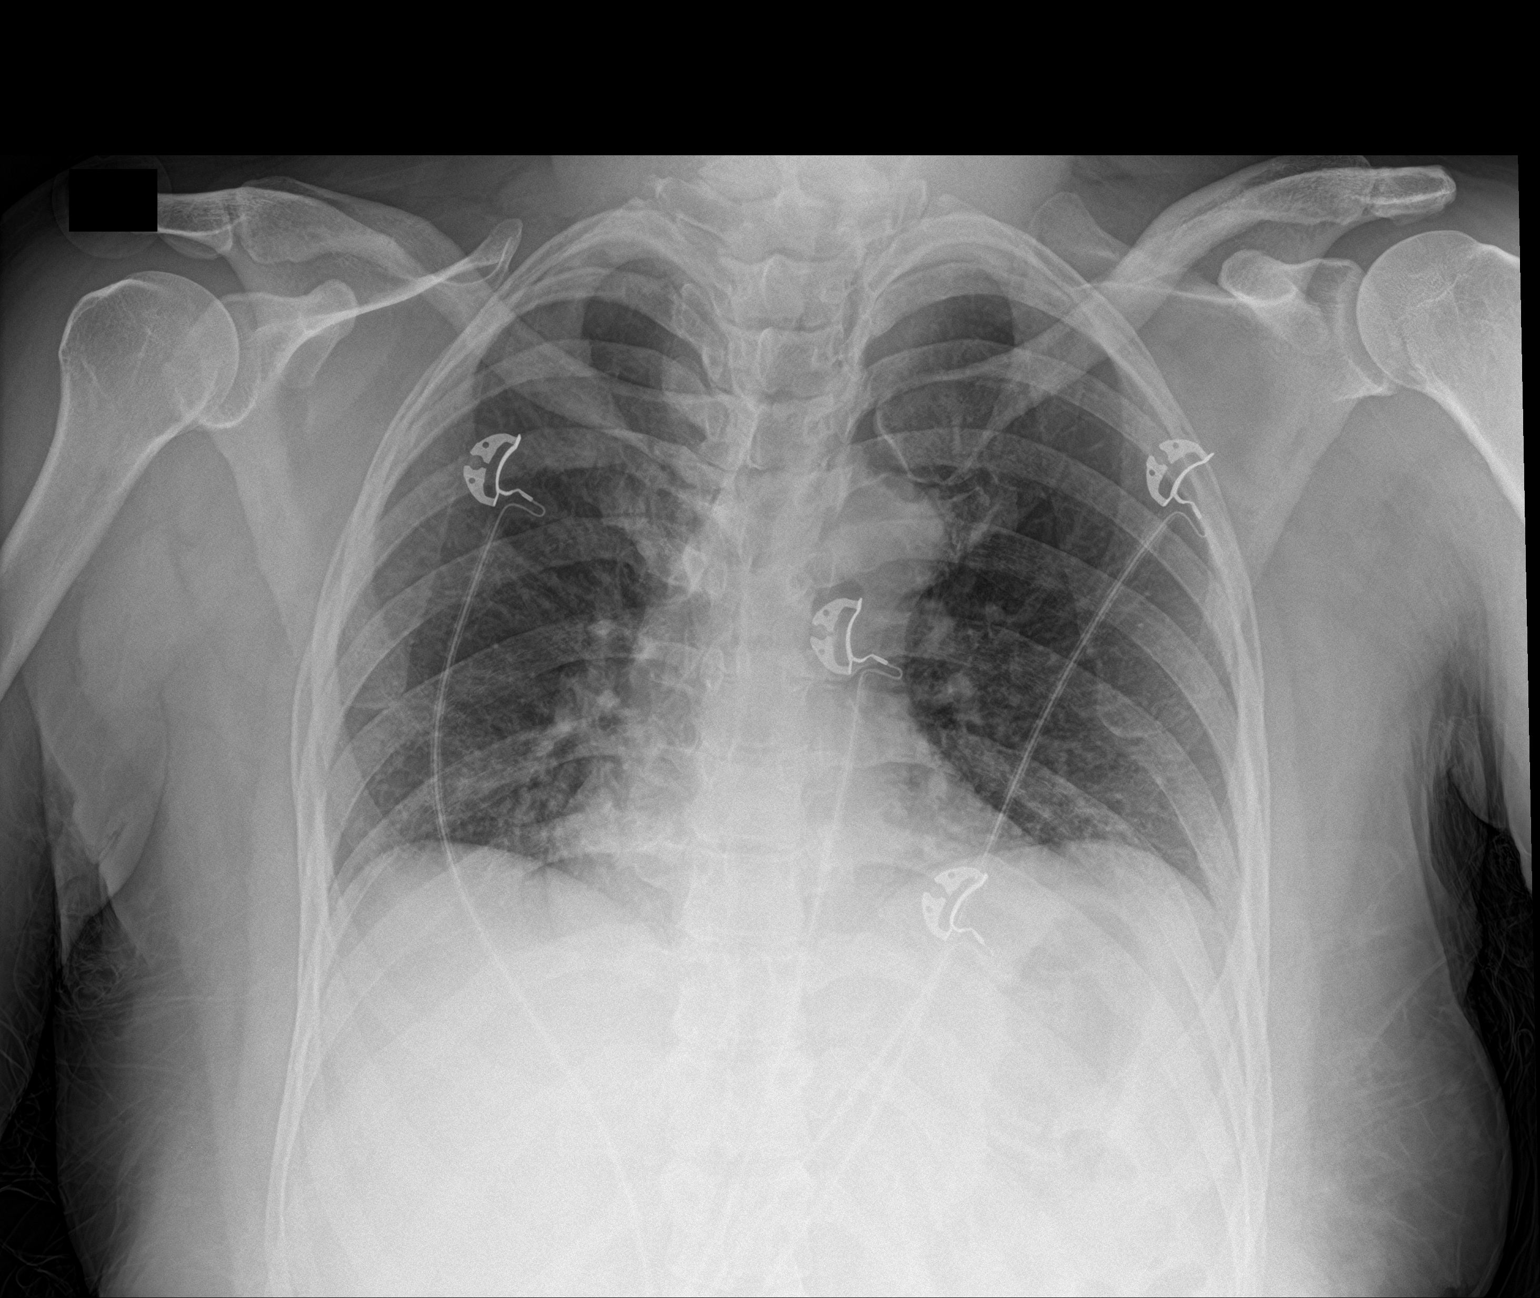

[1 of 1 positions shown; findings below may reference images not displayed]

FINDINGS: The lungs are mildly hypoexpanded. Mild bibasilar opacities likely
reflect atelectasis. There is no evidence of pleural effusion or
pneumothorax.

The cardiomediastinal silhouette is borderline normal in size. No
acute osseous abnormalities are seen.
IMPRESSION: Lungs mildly hypoexpanded. Mild bibasilar opacities likely reflect
atelectasis.

## 2017-12-11 ENCOUNTER — Other Ambulatory Visit: Payer: Self-pay | Admitting: Family Medicine

## 2017-12-11 DIAGNOSIS — Z1231 Encounter for screening mammogram for malignant neoplasm of breast: Secondary | ICD-10-CM

## 2018-01-19 ENCOUNTER — Ambulatory Visit
Admission: RE | Admit: 2018-01-19 | Discharge: 2018-01-19 | Disposition: A | Discharge status: Home / Self Care | Payer: Medicare Other | Source: Ambulatory Visit | Attending: Family Medicine | Admitting: Family Medicine

## 2018-01-19 DIAGNOSIS — Z1231 Encounter for screening mammogram for malignant neoplasm of breast: Secondary | ICD-10-CM | POA: Diagnosis not present

## 2018-02-24 ENCOUNTER — Ambulatory Visit
Admission: RE | Admit: 2018-02-24 | Discharge: 2018-02-24 | Disposition: A | Discharge status: Home / Self Care | Payer: Medicare Other | Source: Ambulatory Visit | Attending: Family Medicine | Admitting: Family Medicine

## 2018-02-24 ENCOUNTER — Other Ambulatory Visit: Payer: Self-pay | Admitting: Family Medicine

## 2018-02-24 DIAGNOSIS — R52 Pain, unspecified: Secondary | ICD-10-CM

## 2018-03-09 ENCOUNTER — Emergency Department
Admission: EM | Admit: 2018-03-09 | Discharge: 2018-03-09 | Disposition: A | Discharge status: Home / Self Care | Payer: No Typology Code available for payment source | Attending: Emergency Medicine | Admitting: Emergency Medicine

## 2018-03-09 ENCOUNTER — Emergency Department: Payer: No Typology Code available for payment source

## 2018-03-09 ENCOUNTER — Other Ambulatory Visit: Payer: Self-pay

## 2018-03-09 ENCOUNTER — Encounter: Payer: Self-pay | Admitting: Emergency Medicine

## 2018-03-09 DIAGNOSIS — E119 Type 2 diabetes mellitus without complications: Secondary | ICD-10-CM | POA: Insufficient documentation

## 2018-03-09 DIAGNOSIS — Z79899 Other long term (current) drug therapy: Secondary | ICD-10-CM | POA: Diagnosis not present

## 2018-03-09 DIAGNOSIS — M545 Low back pain, unspecified: Secondary | ICD-10-CM

## 2018-03-09 DIAGNOSIS — M546 Pain in thoracic spine: Secondary | ICD-10-CM | POA: Diagnosis present

## 2018-03-09 DIAGNOSIS — I10 Essential (primary) hypertension: Secondary | ICD-10-CM | POA: Insufficient documentation

## 2018-03-09 DIAGNOSIS — E785 Hyperlipidemia, unspecified: Secondary | ICD-10-CM | POA: Diagnosis not present

## 2018-03-09 MED ORDER — METAXALONE 800 MG PO TABS: 400.0000 mg | ORAL_TABLET | Freq: Three times a day (TID) | ORAL | 0 refills | Status: DC | PRN

## 2018-03-09 MED ORDER — KETOROLAC TROMETHAMINE 30 MG/ML IJ SOLN
30.0000 mg | Freq: Once | INTRAMUSCULAR | Status: AC
  Administered 2018-03-09: 30 mg via INTRAMUSCULAR
  Filled 2018-03-09: qty 1

## 2018-03-09 MED ORDER — MELOXICAM 15 MG PO TABS: 15.0000 mg | ORAL_TABLET | Freq: Every day | ORAL | 0 refills | Status: DC
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
## 2018-03-09 NOTE — Discharge Instructions (Signed)
Please follow up with your primary care provider to discuss physical therapy.  Return to the ER for symptoms that change or worsen if unable to schedule an appointment.
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
## 2018-03-17 ENCOUNTER — Emergency Department
Admission: EM | Admit: 2018-03-17 | Discharge: 2018-03-17 | Disposition: A | Discharge status: Home / Self Care | Payer: Medicare Other | Attending: Emergency Medicine | Admitting: Emergency Medicine

## 2018-03-17 ENCOUNTER — Other Ambulatory Visit: Payer: Self-pay

## 2018-03-17 ENCOUNTER — Encounter: Payer: Self-pay | Admitting: Emergency Medicine

## 2018-03-17 DIAGNOSIS — E119 Type 2 diabetes mellitus without complications: Secondary | ICD-10-CM | POA: Diagnosis not present

## 2018-03-17 DIAGNOSIS — Z79899 Other long term (current) drug therapy: Secondary | ICD-10-CM | POA: Diagnosis not present

## 2018-03-17 DIAGNOSIS — Z794 Long term (current) use of insulin: Secondary | ICD-10-CM | POA: Diagnosis not present

## 2018-03-17 DIAGNOSIS — M5442 Lumbago with sciatica, left side: Secondary | ICD-10-CM | POA: Insufficient documentation

## 2018-03-17 DIAGNOSIS — I10 Essential (primary) hypertension: Secondary | ICD-10-CM | POA: Diagnosis not present

## 2018-03-17 DIAGNOSIS — M5441 Lumbago with sciatica, right side: Secondary | ICD-10-CM | POA: Insufficient documentation

## 2018-03-17 DIAGNOSIS — Z7982 Long term (current) use of aspirin: Secondary | ICD-10-CM | POA: Insufficient documentation

## 2018-03-17 DIAGNOSIS — M545 Low back pain: Secondary | ICD-10-CM | POA: Diagnosis present

## 2018-03-17 MED ORDER — TRAMADOL HCL 50 MG PO TABS: 50.0000 mg | ORAL_TABLET | Freq: Four times a day (QID) | ORAL | 0 refills | Status: DC | PRN

## 2018-03-17 MED ORDER — TRAMADOL HCL 50 MG PO TABS
50.0000 mg | ORAL_TABLET | Freq: Once | ORAL | Status: AC
  Administered 2018-03-17: 50 mg via ORAL
  Filled 2018-03-17: qty 1
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
## 2018-03-18 ENCOUNTER — Encounter: Payer: Self-pay | Admitting: Internal Medicine

## 2018-03-18 ENCOUNTER — Inpatient Hospital Stay: Payer: Medicare Other | Attending: Internal Medicine | Admitting: Internal Medicine

## 2018-03-18 ENCOUNTER — Inpatient Hospital Stay: Payer: Medicare Other

## 2018-03-18 DIAGNOSIS — D696 Thrombocytopenia, unspecified: Secondary | ICD-10-CM | POA: Insufficient documentation

## 2018-03-18 DIAGNOSIS — E119 Type 2 diabetes mellitus without complications: Secondary | ICD-10-CM | POA: Diagnosis not present

## 2018-03-18 LAB — COMPREHENSIVE METABOLIC PANEL
ALT: 31 U/L (ref 0–44)
AST: 30 U/L (ref 15–41)
Albumin: 3.9 g/dL (ref 3.5–5.0)
Alkaline Phosphatase: 88 U/L (ref 38–126)
Anion gap: 7 (ref 5–15)
BUN: 18 mg/dL (ref 6–20)
CO2: 27 mmol/L (ref 22–32)
Calcium: 9.3 mg/dL (ref 8.9–10.3)
Chloride: 104 mmol/L (ref 98–111)
Creatinine, Ser: 1.26 mg/dL — ABNORMAL HIGH (ref 0.44–1.00)
GFR calc Af Amer: 57 mL/min — ABNORMAL LOW (ref >60)
GFR, EST NON AFRICAN AMERICAN: 49 mL/min — AB (ref >60)
Glucose, Bld: 169 mg/dL — ABNORMAL HIGH (ref 70–99)
Potassium: 3.7 mmol/L (ref 3.5–5.1)
Sodium: 138 mmol/L (ref 135–145)
TOTAL PROTEIN: 7.4 g/dL (ref 6.5–8.1)
Total Bilirubin: 0.5 mg/dL (ref 0.3–1.2)

## 2018-03-18 LAB — CBC WITH DIFFERENTIAL/PLATELET
Abs Immature Granulocytes: 0.03 10*3/uL (ref 0.00–0.07)
BASOS PCT: 0 %
Basophils Absolute: 0 10*3/uL (ref 0.0–0.1)
Eosinophils Absolute: 0.1 10*3/uL (ref 0.0–0.5)
Eosinophils Relative: 1 %
HCT: 33.3 % — ABNORMAL LOW (ref 36.0–46.0)
HEMOGLOBIN: 11.6 g/dL — AB (ref 12.0–15.0)
Immature Granulocytes: 1 %
Lymphocytes Relative: 12 %
Lymphs Abs: 0.8 10*3/uL (ref 0.7–4.0)
MCH: 31.2 pg (ref 26.0–34.0)
MCHC: 34.8 g/dL (ref 30.0–36.0)
MCV: 89.5 fL (ref 80.0–100.0)
Monocytes Absolute: 0.5 10*3/uL (ref 0.1–1.0)
Monocytes Relative: 7 %
Neutro Abs: 5.2 10*3/uL (ref 1.7–7.7)
Neutrophils Relative %: 79 %
Platelets: 124 10*3/uL — ABNORMAL LOW (ref 150–400)
RBC: 3.72 MIL/uL — AB (ref 3.87–5.11)
RDW: 12.4 % (ref 11.5–15.5)
WBC: 6.5 10*3/uL (ref 4.0–10.5)
nRBC: 0 % (ref 0.0–0.2)

## 2018-03-18 LAB — VITAMIN B12: Vitamin B-12: 584 pg/mL (ref 180–914)

## 2018-03-18 LAB — TECHNOLOGIST SMEAR REVIEW: Plt Morphology: DECREASED

## 2018-03-18 LAB — LACTATE DEHYDROGENASE: LDH: 151 U/L (ref 98–192)

## 2018-03-18 LAB — FOLATE: Folate: 30 ng/mL (ref >5.9)

## 2018-03-18 NOTE — Assessment & Plan Note (Addendum)
#  Chronic thrombocytopenia of unclear etiology.  Last platelet count 2018-130s.  #Had a long discussion with the patient's caregiver regarding the multiple causes of thrombocytopenia including but not limited to medications liver disease/splenomegaly infections; immune related thrombocytopenia; less likely malignant causes.   # Recommend checking CBC;CMP WITH LDH; Check peripheral smear. Hepatitis workup. Y50 and folic acid. CT- 2017- NEG for spleen/liver disease.  # DM- II stable.   # Psychiatric illness- stable;   Also, discussed with the patient that if no clear etiology found- bone marrow biopsy would be suggested. Currently await for the above workup.   Thank you Ms. Lavena Bullion for allowing me to participate in the care of your pleasant patient. Please do not hesitate to contact me with questions or concerns in the interim.  # 45 minutes face-to-face with the patient's care giver discussing the above plan of care; more than 50% of time spent on prognosis/ natural history; counseling and coordination.  # DISPOSITION: # labs today # follow up in 2 weeks-MD- NO labs-dr.B
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
## 2018-03-19 LAB — HIV ANTIBODY (ROUTINE TESTING W REFLEX): HIV Screen 4th Generation wRfx: NONREACTIVE

## 2018-03-19 LAB — HEPATITIS C ANTIBODY: HCV Ab: <0.1 s/co ratio (ref 0.0–0.9)

## 2018-03-19 LAB — HEPATITIS B SURFACE ANTIGEN: Hepatitis B Surface Ag: NEGATIVE

## 2018-03-19 LAB — HEPATITIS B CORE ANTIBODY, IGM: Hep B C IgM: NEGATIVE

## 2018-04-01 ENCOUNTER — Other Ambulatory Visit: Payer: Self-pay | Admitting: Physician Assistant

## 2018-04-01 ENCOUNTER — Telehealth: Payer: Self-pay | Admitting: *Deleted

## 2018-04-01 ENCOUNTER — Inpatient Hospital Stay: Payer: Medicare Other | Admitting: Internal Medicine

## 2018-04-01 ENCOUNTER — Other Ambulatory Visit (HOSPITAL_COMMUNITY): Payer: Self-pay | Admitting: Physician Assistant

## 2018-04-01 DIAGNOSIS — M4726 Other spondylosis with radiculopathy, lumbar region: Secondary | ICD-10-CM

## 2018-04-01 NOTE — Assessment & Plan Note (Signed)
#  Chronic thrombocytopenia of unclear etiology.  Last platelet count 2018-130s.  #Had a long discussion with the patient's caregiver regarding the multiple causes of thrombocytopenia including but not limited to medications liver disease/splenomegaly infections; immune related thrombocytopenia; less likely malignant causes.   # Recommend checking CBC;CMP WITH LDH; Check peripheral smear. Hepatitis workup. Y50 and folic acid. CT- 2017- NEG for spleen/liver disease.  # DM- II stable.   # Psychiatric illness- stable;   Also, discussed with the patient that if no clear etiology found- bone marrow biopsy would be suggested. Currently await for the above workup.   Thank you Ms. Lavena Bullion for allowing me to participate in the care of your pleasant patient. Please do not hesitate to contact me with questions or concerns in the interim.  # 45 minutes face-to-face with the patient's care giver discussing the above plan of care; more than 50% of time spent on prognosis/ natural history; counseling and coordination.  # DISPOSITION: # labs today # follow up in 2 weeks-MD- NO labs-dr.B
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
## 2018-04-01 NOTE — Telephone Encounter (Signed)
Ms Dan Humphreys called reporting that they missed a call last week and thinks it is ion regard to patient lab results. Asking for return call, but I see patient had an appointment this morning

## 2018-04-01 NOTE — Telephone Encounter (Signed)
Steward Drone, we did not call her this may have been her reminder call regarding her apts that she missed today.

## 2018-04-02 ENCOUNTER — Telehealth: Payer: Self-pay | Admitting: *Deleted
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
## 2018-04-07 ENCOUNTER — Ambulatory Visit
Admission: RE | Admit: 2018-04-07 | Discharge: 2018-04-07 | Disposition: A | Discharge status: Home / Self Care | Payer: Medicare Other | Source: Ambulatory Visit | Attending: Physician Assistant | Admitting: Physician Assistant

## 2018-04-07 ENCOUNTER — Other Ambulatory Visit: Payer: Self-pay

## 2018-04-07 DIAGNOSIS — M4726 Other spondylosis with radiculopathy, lumbar region: Secondary | ICD-10-CM | POA: Diagnosis present

## 2018-04-07 MED ORDER — GADOBUTROL 1 MMOL/ML IV SOLN
7.5000 mL | Freq: Once | INTRAVENOUS | Status: AC | PRN
  Administered 2018-04-07: 7.5 mL via INTRAVENOUS

## 2018-10-06 ENCOUNTER — Other Ambulatory Visit: Payer: Self-pay | Admitting: Family Medicine

## 2018-10-06 DIAGNOSIS — Z1231 Encounter for screening mammogram for malignant neoplasm of breast: Secondary | ICD-10-CM

## 2018-11-06 ENCOUNTER — Other Ambulatory Visit: Payer: Self-pay

## 2018-11-06 ENCOUNTER — Other Ambulatory Visit
Admission: RE | Admit: 2018-11-06 | Discharge: 2018-11-06 | Disposition: A | Discharge status: Home / Self Care | Payer: Medicare Other | Source: Ambulatory Visit | Attending: General Surgery | Admitting: General Surgery

## 2018-11-06 DIAGNOSIS — Z20828 Contact with and (suspected) exposure to other viral communicable diseases: Secondary | ICD-10-CM | POA: Diagnosis present

## 2018-11-06 LAB — SARS CORONAVIRUS 2 (TAT 6-24 HRS): SARS Coronavirus 2: NEGATIVE

## 2018-11-10 ENCOUNTER — Encounter: Payer: Self-pay | Admitting: *Deleted

## 2018-11-11 ENCOUNTER — Other Ambulatory Visit: Payer: Self-pay

## 2018-11-11 ENCOUNTER — Ambulatory Visit
Admission: RE | Admit: 2018-11-11 | Discharge: 2018-11-11 | Disposition: A | Discharge status: Home / Self Care | Payer: Medicare Other | Attending: General Surgery | Admitting: General Surgery

## 2018-11-11 ENCOUNTER — Ambulatory Visit: Payer: Medicare Other | Admitting: Anesthesiology

## 2018-11-11 ENCOUNTER — Encounter: Payer: Self-pay | Admitting: Anesthesiology

## 2018-11-11 ENCOUNTER — Encounter
Admission: RE | Disposition: A | Discharge status: Home / Self Care | Payer: Self-pay | Source: Home / Self Care | Attending: General Surgery

## 2018-11-11 DIAGNOSIS — E119 Type 2 diabetes mellitus without complications: Secondary | ICD-10-CM | POA: Insufficient documentation

## 2018-11-11 DIAGNOSIS — Z1211 Encounter for screening for malignant neoplasm of colon: Secondary | ICD-10-CM | POA: Insufficient documentation

## 2018-11-11 DIAGNOSIS — Q438 Other specified congenital malformations of intestine: Secondary | ICD-10-CM | POA: Diagnosis not present

## 2018-11-11 DIAGNOSIS — Z791 Long term (current) use of non-steroidal anti-inflammatories (NSAID): Secondary | ICD-10-CM | POA: Insufficient documentation

## 2018-11-11 DIAGNOSIS — F329 Major depressive disorder, single episode, unspecified: Secondary | ICD-10-CM | POA: Diagnosis not present

## 2018-11-11 DIAGNOSIS — E785 Hyperlipidemia, unspecified: Secondary | ICD-10-CM | POA: Diagnosis not present

## 2018-11-11 DIAGNOSIS — Z794 Long term (current) use of insulin: Secondary | ICD-10-CM | POA: Insufficient documentation

## 2018-11-11 DIAGNOSIS — Z79899 Other long term (current) drug therapy: Secondary | ICD-10-CM | POA: Diagnosis not present

## 2018-11-11 DIAGNOSIS — F209 Schizophrenia, unspecified: Secondary | ICD-10-CM | POA: Diagnosis not present

## 2018-11-11 DIAGNOSIS — R1013 Epigastric pain: Secondary | ICD-10-CM | POA: Diagnosis present

## 2018-11-11 DIAGNOSIS — I10 Essential (primary) hypertension: Secondary | ICD-10-CM | POA: Insufficient documentation

## 2018-11-11 DIAGNOSIS — F99 Mental disorder, not otherwise specified: Secondary | ICD-10-CM | POA: Diagnosis not present

## 2018-11-11 DIAGNOSIS — F419 Anxiety disorder, unspecified: Secondary | ICD-10-CM | POA: Insufficient documentation

## 2018-11-11 DIAGNOSIS — K319 Disease of stomach and duodenum, unspecified: Secondary | ICD-10-CM | POA: Insufficient documentation

## 2018-11-11 DIAGNOSIS — Z7982 Long term (current) use of aspirin: Secondary | ICD-10-CM | POA: Diagnosis not present

## 2018-11-11 DIAGNOSIS — Z79890 Hormone replacement therapy (postmenopausal): Secondary | ICD-10-CM | POA: Insufficient documentation

## 2018-11-11 DIAGNOSIS — Z86010 Personal history of colonic polyps: Secondary | ICD-10-CM | POA: Insufficient documentation

## 2018-11-11 HISTORY — PX: ESOPHAGOGASTRODUODENOSCOPY (EGD) WITH PROPOFOL: SHX5813

## 2018-11-11 HISTORY — PX: COLONOSCOPY WITH PROPOFOL: SHX5780

## 2018-11-11 LAB — GLUCOSE, CAPILLARY
Glucose-Capillary: 55 mg/dL — ABNORMAL LOW (ref 70–99)
Glucose-Capillary: 105 mg/dL — ABNORMAL HIGH (ref 70–99)

## 2018-11-11 SURGERY — COLONOSCOPY WITH PROPOFOL
Anesthesia: General

## 2018-11-11 MED ORDER — DEXTROSE 50 % IV SOLN
50.0000 mL | Freq: Once | INTRAVENOUS | Status: AC
  Administered 2018-11-11: 08:00:00 50 mL via INTRAVENOUS

## 2018-11-11 MED ORDER — DEXTROSE 50 % IV SOLN
INTRAVENOUS | Status: AC
  Administered 2018-11-11: 50 mL via INTRAVENOUS
  Filled 2018-11-11: qty 50

## 2018-11-11 MED ORDER — PROPOFOL 10 MG/ML IV BOLUS
INTRAVENOUS | Status: DC | PRN
  Administered 2018-11-11: 70 mg via INTRAVENOUS

## 2018-11-11 MED ORDER — PHENYLEPHRINE HCL (PRESSORS) 10 MG/ML IV SOLN
INTRAVENOUS | Status: DC | PRN
  Administered 2018-11-11: 100 ug via INTRAVENOUS

## 2018-11-11 MED ORDER — PROPOFOL 500 MG/50ML IV EMUL
INTRAVENOUS | Status: DC | PRN
  Administered 2018-11-11: 120 ug/kg/min via INTRAVENOUS

## 2018-11-11 MED ORDER — SODIUM CHLORIDE 0.9 % IV SOLN
INTRAVENOUS | Status: DC
  Administered 2018-11-11: 08:00:00 via INTRAVENOUS

## 2018-11-11 NOTE — Anesthesia Preprocedure Evaluation (Addendum)
Anesthesia Evaluation  Patient identified by MRN, date of birth, ID band Patient awake    Reviewed: Allergy & Precautions, NPO status , Patient's Chart, lab work & pertinent test results  History of Anesthesia Complications Negative for: history of anesthetic complications  Airway Mallampati: II  TM Distance: >3 FB Neck ROM: Full    Dental  (+) Edentulous Upper, Edentulous Lower Hx of poor dentition:   Pulmonary neg pulmonary ROS,    Pulmonary exam normal breath sounds clear to auscultation       Cardiovascular hypertension, Pt. on medications and Pt. on home beta blockers Normal cardiovascular exam Rhythm:Regular Rate:Normal     Neuro/Psych PSYCHIATRIC DISORDERS Anxiety Depression Schizophrenia negative neurological ROS     GI/Hepatic negative GI ROS, Neg liver ROS,   Endo/Other  diabetes, Type 2, Oral Hypoglycemic Agents, Insulin Dependent  Renal/GU negative Renal ROS     Musculoskeletal  (+) Arthritis ,   Abdominal   Peds  Hematology   Anesthesia Other Findings   Reproductive/Obstetrics negative OB ROS                            Anesthesia Physical  Anesthesia Plan  ASA: III  Anesthesia Plan: General   Post-op Pain Management:    Induction: Intravenous  PONV Risk Score and Plan: TIVA  Airway Management Planned:   Additional Equipment:   Intra-op Plan:   Post-operative Plan:   Informed Consent: I have reviewed the patients History and Physical, chart, labs and discussed the procedure including the risks, benefits and alternatives for the proposed anesthesia with the patient or authorized representative who has indicated his/her understanding and acceptance.       Plan Discussed with: CRNA, Anesthesiologist and Surgeon  Anesthesia Plan Comments:         Anesthesia Quick Evaluation

## 2018-11-11 NOTE — Anesthesia Post-op Follow-up Note (Signed)
Anesthesia QCDR form completed.        

## 2018-11-11 NOTE — Op Note (Signed)
[facility] Gastroenterology Patient Name: Kristina Foster Procedure Date: 11/11/2018 7:33 AM MRN: [id number] Account #: [id number] Date of Birth: 27-Feb-1965 Admit Type: Outpatient Age: 53 Room: [facility] ENDO ROOM 1 Gender: Female Note Status: Finalized Procedure:            Colonoscopy Indications:          High risk colon cancer surveillance: Personal history                        of colonic polyps Providers:            Earline Mayotte, MD Medicines:            Monitored Anesthesia Care Complications:        No immediate complications. Procedure:            Pre-Anesthesia Assessment:                       - Prior to the procedure, a History and Physical was                        performed, and patient medications, allergies and                        sensitivities were reviewed. The patient's tolerance of                        previous anesthesia was reviewed.                       - The risks and benefits of the procedure and the                        sedation options and risks were discussed with the                        patient. All questions were answered and informed                        consent was obtained.                       After obtaining informed consent, the colonoscope was                        passed under direct vision. Throughout the procedure,                        the patient's blood pressure, pulse, and oxygen                        saturations were monitored continuously. The                        Colonoscope was introduced through the anus and                        advanced to the the cecum, identified by appendiceal                        orifice and ileocecal valve. The colonoscopy was  somewhat difficult due to significant looping and a                        tortuous colon. Successful completion of the procedure                        was aided by changing the patient to a prone position.            The patient tolerated the procedure well. The quality                        of the bowel preparation was good. Findings:      The entire examined colon appeared normal on direct and retroflexion       views. Impression:           - The entire examined colon is normal on direct and                        retroflexion views.                       - No specimens collected. Recommendation:       - Discharge patient to home (via wheelchair). Procedure Code(s):    --- Professional ---                       [phone], Colonoscopy, flexible; diagnostic, including                        collection of specimen(s) by brushing or washing, when                        performed (separate procedure) Diagnosis Code(s):    --- Professional ---                       Z86.010, Personal history of colonic polyps CPT copyright 2019 American Medical Association. All rights reserved. The codes documented in this report are preliminary and upon coder review may  be revised to meet current compliance requirements. Robert Bellow, MD 11/11/2018 9:13:08 AM This report has been signed electronically. Number of Addenda: 0 Note Initiated On: 11/11/2018 7:33 AM Scope Withdrawal Time: 0 hours 33 minutes 24 seconds  Total Procedure Duration: 0 hours 50 minutes 22 seconds  Estimated Blood Loss: Estimated blood loss: none.      [facility]

## 2018-11-11 NOTE — Op Note (Addendum)
[facility] Gastroenterology Patient Name: Kristina Foster Procedure Date: 11/11/2018 7:34 AM MRN: [id number] Account #: [id number] Date of Birth: 10/28/1965 Admit Type: Outpatient Age: 53 Room: [facility] ENDO ROOM 1 Gender: Female Note Status: Finalized Procedure:            Upper GI endoscopy Indications:          Epigastric abdominal pain Providers:            Robert Bellow, MD Medicines:            Monitored Anesthesia Care Complications:        No immediate complications. Procedure:            Pre-Anesthesia Assessment:                       - Prior to the procedure, a History and Physical was                        performed, and patient medications, allergies and                        sensitivities were reviewed. The patient's tolerance of                        previous anesthesia was reviewed.                       - The risks and benefits of the procedure and the                        sedation options and risks were discussed with the                        patient. All questions were answered and informed                        consent was obtained.                       After obtaining informed consent, the endoscope was                        passed under direct vision. Throughout the procedure,                        the patient's blood pressure, pulse, and oxygen                        saturations were monitored continuously. The Endoscope                        was introduced through the mouth, and advanced to the                        second part of duodenum. The upper GI endoscopy was                        accomplished without difficulty. The patient tolerated                        the procedure well. Findings:  The esophagus was normal.      Segmental mild inflammation characterized by erythema was found in the       gastric body. This was biopsied with a cold forceps for histology.      The examined duodenum was normal. Impression:            - Normal esophagus.                       - Gastritis. Biopsied.                       - Normal examined duodenum. Recommendation:       - Perform a colonoscopy today. Procedure Code(s):    --- Professional ---                       [phone], Esophagogastroduodenoscopy, flexible, transoral;                        with biopsy, single or multiple Diagnosis Code(s):    --- Professional ---                       K29.70, Gastritis, unspecified, without bleeding                       R10.13, Epigastric pain CPT copyright 2019 American Medical Association. All rights reserved. The codes documented in this report are preliminary and upon coder review may  be revised to meet current compliance requirements. Earline Mayotte, MD 11/11/2018 8:17:33 AM This report has been signed electronically. Number of Addenda: 0 Note Initiated On: 11/11/2018 7:34 AM Estimated Blood Loss: Estimated blood loss: none.      [facility]

## 2018-11-11 NOTE — H&P (Signed)
Kristina Foster [id number] 30-Sep-1965     HPI: 53 year old woman with past history of colonic polyps and a more recent history of upper abdominal pain.  Known thrombocytopenia without source.  For upper and lower endoscopy.  Medications Prior to Admission  Medication Sig Dispense Refill Last Dose  . acetaminophen (TYLENOL) 650 MG suppository Place 650 mg rectally every 8 (eight) hours as needed.     Marland Kitchen aspirin EC 81 MG tablet Take 81 mg by mouth daily.   11/10/2018 at Unknown time  . atorvastatin (LIPITOR) 10 MG tablet Take 10 mg by mouth daily.   11/10/2018 at Unknown time  . benztropine (COGENTIN) 2 MG tablet Take 2 mg by mouth 2 (two) times daily.   11/10/2018 at Unknown time  . colesevelam (WELCHOL) 625 MG tablet Take by mouth 3 (three) times daily with meals.    11/10/2018 at Unknown time  . estradiol (ESTRACE) 0.5 MG tablet Take 0.5 mg by mouth daily.   11/10/2018 at Unknown time  . furosemide (LASIX) 40 MG tablet Take 1 tablet by mouth daily.   11/10/2018 at Unknown time  . gabapentin (NEURONTIN) 100 MG capsule Take 100 mg by mouth daily.   11/10/2018 at Unknown time  . guaiFENesin-dextromethorphan (ROBITUSSIN DM) 100-10 MG/5ML syrup Take 5 mLs by mouth every 6 (six) hours as needed for cough.   11/10/2018 at Unknown time  . INGREZZA 40 MG CAPS Take 2 tablets by mouth daily.   11/10/2018 at Unknown time  . insulin glargine (LANTUS) 100 UNIT/ML injection Inject 35 Units into the skin at bedtime.    11/10/2018 at Unknown time  . lisinopril-hydrochlorothiazide (PRINZIDE,ZESTORETIC) 10-12.5 MG per tablet Take 1 tablet by mouth daily.   11/10/2018 at Unknown time  . magnesium hydroxide (MILK OF MAGNESIA) 400 MG/5ML suspension Take by mouth daily as needed for mild constipation.   11/10/2018 at Unknown time  . metoprolol succinate (TOPROL-XL) 100 MG 24 hr tablet Take 200 mg by mouth daily.    11/11/2018 at Unknown time  . NOVOLOG MIX 70/30 FLEXPEN (70-30) 100 UNIT/ML FlexPen Inject 35 Units  into the skin daily with breakfast.    11/11/2018 at Unknown time  . polyethylene glycol (MIRALAX / GLYCOLAX) 17 g packet Take 17 g by mouth daily.   11/10/2018 at Unknown time  . potassium chloride SA (K-DUR,KLOR-CON) 20 MEQ tablet Take 1 tablet by mouth daily.   11/10/2018 at Unknown time  . risperiDONE (RISPERDAL) 3 MG tablet Take 3 mg by mouth at bedtime.    11/10/2018 at Unknown time  . TRULICITY 0.75 MG/0.5ML SOPN Inject 0.5 mLs into the skin once a week. mondays   11/10/2018 at Unknown time  . venlafaxine (EFFEXOR) 75 MG tablet Take 75 mg by mouth 2 (two) times daily.   11/10/2018 at Unknown time  . meloxicam (MOBIC) 15 MG tablet Take 1 tablet (15 mg total) by mouth daily. 30 tablet 0   . metaxalone (SKELAXIN) 800 MG tablet Take 0.5 tablets (400 mg total) by mouth 3 (three) times daily as needed for muscle spasms. 30 tablet 0   . traMADol (ULTRAM) 50 MG tablet Take 1 tablet (50 mg total) by mouth every 6 (six) hours as needed. 15 tablet 0    No Known Allergies Past Medical History:  Diagnosis Date  . Anxiety   . Arthritis   . Constipation   . Depression   . Diabetes mellitus without complication (HCC)    Type 2   . Dizziness   .  Hyperlipemia   . Hypertension   . Mental disability   . Schizo affective schizophrenia [facility])    Past Surgical History:  Procedure Laterality Date  . ABDOMINAL HYSTERECTOMY    . BACK SURGERY     Social History   Socioeconomic History  . Marital status: Single    Spouse name: Not on file  . Number of children: Not on file  . Years of education: Not on file  . Highest education level: Not on file  Occupational History  . Not on file  Social Needs  . Financial resource strain: Not on file  . Food insecurity    Worry: Not on file    Inability: Not on file  . Transportation needs    Medical: Not on file    Non-medical: Not on file  Tobacco Use  . Smoking status: Never Smoker  . Smokeless tobacco: Never Used  Substance and Sexual Activity   . Alcohol use: No  . Drug use: No  . Sexual activity: Never  Lifestyle  . Physical activity    Days per week: Not on file    Minutes per session: Not on file  . Stress: Not on file  Relationships  . Social Herbalist on phone: Not on file    Gets together: Not on file    Attends religious service: Not on file    Active member of club or organization: Not on file    Attends meetings of clubs or organizations: Not on file    Relationship status: Not on file  . Intimate partner violence    Fear of current or ex partner: Not on file    Emotionally abused: Not on file    Physically abused: Not on file    Forced sexual activity: Not on file  Other Topics Concern  . Not on file  Social History Narrative   Disabled; no smoking or alcohol; in [place] with care giver [35 years].    Social History   Social History Narrative   Disabled; no smoking or alcohol; in [place] with care giver [35 years].      ROS: Negative.     PE: HEENT: Negative. Lungs: Clear. Cardio: RR.  Assessment/Plan:  Proceed with planned upper and lower endoscopy endoscopy. Forest Gleason Duan Scharnhorst 11/11/2018

## 2018-11-11 NOTE — Anesthesia Postprocedure Evaluation (Signed)
Anesthesia Post Note  Patient: Kristina Foster  Procedure(s) Performed: COLONOSCOPY WITH PROPOFOL (N/A ) ESOPHAGOGASTRODUODENOSCOPY (EGD) WITH PROPOFOL (N/A )  Patient location during evaluation: Endoscopy Anesthesia Type: General Level of consciousness: awake and alert and oriented Pain management: pain level controlled Vital Signs Assessment: post-procedure vital signs reviewed and stable Respiratory status: spontaneous breathing Cardiovascular status: blood pressure returned to baseline Anesthetic complications: no     Last Vitals:  Vitals:   11/11/18 0913 11/11/18 0915  BP: (!) 88/62 (!) 88/62  Pulse:  93  Resp:  19  Temp: (!) 36.4 C (!) 36.4 C  SpO2: 99% 98%    Last Pain:  Vitals:   11/11/18 0943  TempSrc:   PainSc: 0-No pain                 [name]

## 2018-11-11 NOTE — Transfer of Care (Signed)
Immediate Anesthesia Transfer of Care Note  Patient: RAKEISHA NYCE  Procedure(s) Performed: COLONOSCOPY WITH PROPOFOL (N/A ) ESOPHAGOGASTRODUODENOSCOPY (EGD) WITH PROPOFOL (N/A )  Patient Location: PACU  Anesthesia Type:sedated  Level of Consciousness: awake, alert  and oriented  Airway & Oxygen Therapy: Patient Spontanous Breathing and Patient connected to nasal cannula oxygen  Post-op Assessment: Report given to RN and Post -op Vital signs reviewed and stable  Post vital signs: Reviewed and stable  Last Vitals:  Vitals Value Taken Time  BP 88/62 11/11/18 0915  Temp 36.4 C 11/11/18 0915  Pulse 83 11/11/18 0915  Resp 20 11/11/18 0915  SpO2 100 % 11/11/18 0915  Vitals shown include unvalidated device data.  Last Pain:  Vitals:   11/11/18 0915  TempSrc:   PainSc: 0-No pain         Complications: No apparent anesthesia complications

## 2018-11-12 LAB — SURGICAL PATHOLOGY

## 2019-04-02 ENCOUNTER — Telehealth: Payer: Self-pay | Admitting: *Deleted

## 2019-04-02 DIAGNOSIS — D696 Thrombocytopenia, unspecified: Secondary | ICD-10-CM
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
## 2019-04-05 NOTE — Addendum Note (Signed)
Addended by: Keitha Butte on: 04/05/2019 10:35 AM   Modules accepted: Orders

## 2019-04-12 ENCOUNTER — Other Ambulatory Visit: Payer: Self-pay | Admitting: Student

## 2019-04-12 DIAGNOSIS — M545 Low back pain, unspecified: Secondary | ICD-10-CM

## 2019-04-14 ENCOUNTER — Ambulatory Visit
Admission: RE | Admit: 2019-04-14 | Discharge: 2019-04-14 | Disposition: A | Discharge status: Home / Self Care | Payer: Medicare Other | Source: Ambulatory Visit | Attending: Family Medicine | Admitting: Family Medicine

## 2019-04-14 DIAGNOSIS — Z1231 Encounter for screening mammogram for malignant neoplasm of breast: Secondary | ICD-10-CM | POA: Insufficient documentation

## 2019-04-20 ENCOUNTER — Other Ambulatory Visit: Payer: Self-pay

## 2019-04-20 ENCOUNTER — Inpatient Hospital Stay: Payer: Medicare Other | Attending: Internal Medicine

## 2019-04-20 ENCOUNTER — Inpatient Hospital Stay (HOSPITAL_BASED_OUTPATIENT_CLINIC_OR_DEPARTMENT_OTHER): Payer: Medicare Other | Admitting: Internal Medicine

## 2019-04-20 DIAGNOSIS — D696 Thrombocytopenia, unspecified: Secondary | ICD-10-CM | POA: Diagnosis not present

## 2019-04-20 DIAGNOSIS — D693 Immune thrombocytopenic purpura: Secondary | ICD-10-CM

## 2019-04-20 LAB — CBC WITH DIFFERENTIAL/PLATELET
Abs Immature Granulocytes: 0.03 10*3/uL (ref 0.00–0.07)
Basophils Absolute: 0 10*3/uL (ref 0.0–0.1)
Basophils Relative: 0 %
Eosinophils Absolute: 0.2 10*3/uL (ref 0.0–0.5)
Eosinophils Relative: 4 %
HCT: 34 % — ABNORMAL LOW (ref 36.0–46.0)
Hemoglobin: 11.9 g/dL — ABNORMAL LOW (ref 12.0–15.0)
Immature Granulocytes: 1 %
Lymphocytes Relative: 28 %
Lymphs Abs: 1.8 10*3/uL (ref 0.7–4.0)
MCH: 31 pg (ref 26.0–34.0)
MCHC: 35 g/dL (ref 30.0–36.0)
MCV: 88.5 fL (ref 80.0–100.0)
Monocytes Absolute: 0.8 10*3/uL (ref 0.1–1.0)
Monocytes Relative: 12 %
Neutro Abs: 3.7 10*3/uL (ref 1.7–7.7)
Neutrophils Relative %: 55 %
Platelets: 135 10*3/uL — ABNORMAL LOW (ref 150–400)
RBC: 3.84 MIL/uL — ABNORMAL LOW (ref 3.87–5.11)
RDW: 12.4 % (ref 11.5–15.5)
WBC: 6.6 10*3/uL (ref 4.0–10.5)
nRBC: 0 % (ref 0.0–0.2)

## 2019-04-20 LAB — COMPREHENSIVE METABOLIC PANEL
ALT: 27 U/L (ref 0–44)
AST: 21 U/L (ref 15–41)
Albumin: 4.3 g/dL (ref 3.5–5.0)
Alkaline Phosphatase: 104 U/L (ref 38–126)
Anion gap: 8 (ref 5–15)
BUN: 26 mg/dL — ABNORMAL HIGH (ref 6–20)
CO2: 29 mmol/L (ref 22–32)
Calcium: 9.8 mg/dL (ref 8.9–10.3)
Chloride: 101 mmol/L (ref 98–111)
Creatinine, Ser: 1.54 mg/dL — ABNORMAL HIGH (ref 0.44–1.00)
GFR calc Af Amer: 44 mL/min — ABNORMAL LOW (ref >60)
GFR calc non Af Amer: 38 mL/min — ABNORMAL LOW (ref >60)
Glucose, Bld: 106 mg/dL — ABNORMAL HIGH (ref 70–99)
Potassium: 4 mmol/L (ref 3.5–5.1)
Sodium: 138 mmol/L (ref 135–145)
Total Bilirubin: 0.4 mg/dL (ref 0.3–1.2)
Total Protein: 7.7 g/dL (ref 6.5–8.1)
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
## 2019-04-20 NOTE — Assessment & Plan Note (Signed)
#  Chronic thrombocytopenia of unclear etiology- likley ITP.  Last platelet count 2018-130s.  Infectious work-up negative.  Currently platelets are 130s.  #Discussed with patient/caregiver that given the absence of any obvious cause of her thrombocytopenia-ITP is a diagnosis of exclusion.  As patient is asymptomatic would recommend continued surveillance.  However if patient platelets drop < 50 consideration for treatment for ITP will be considered.  Also discussed the need for bone marrow biopsy if her counts get worse or other cell lines involved.  # Psychiatric illness- stable.  # DISPOSITION: # follow up in 8M- MD- labs- cbc/cmp-Dr.B

## 2019-04-20 NOTE — Assessment & Plan Note (Deleted)
#  Chronic thrombocytopenia of unclear etiology- likley ITP.  Last platelet count 2018-130s.    # DM- II stable.   # Psychiatric illness- stable;    # DISPOSITION: # follow up in 80M- MD- labs- cbc/cmp-Dr.B

## 2019-04-23 ENCOUNTER — Ambulatory Visit
Admission: RE | Admit: 2019-04-23 | Discharge: 2019-04-23 | Disposition: A | Discharge status: Home / Self Care | Payer: Medicare Other | Source: Ambulatory Visit | Attending: Student | Admitting: Student

## 2019-04-23 ENCOUNTER — Other Ambulatory Visit: Payer: Self-pay

## 2019-04-23 DIAGNOSIS — M545 Low back pain, unspecified: Secondary | ICD-10-CM

## 2019-10-19 ENCOUNTER — Inpatient Hospital Stay: Payer: Medicare Other | Admitting: Internal Medicine

## 2019-10-19 ENCOUNTER — Inpatient Hospital Stay: Payer: Medicare Other

## 2020-04-25 ENCOUNTER — Other Ambulatory Visit: Payer: Self-pay | Admitting: Family Medicine

## 2020-04-25 DIAGNOSIS — Z1231 Encounter for screening mammogram for malignant neoplasm of breast: Secondary | ICD-10-CM

## 2020-05-12 ENCOUNTER — Ambulatory Visit
Admission: RE | Admit: 2020-05-12 | Discharge: 2020-05-12 | Disposition: A | Discharge status: Home / Self Care | Payer: Medicare Other | Source: Ambulatory Visit | Attending: Family Medicine | Admitting: Family Medicine

## 2020-05-12 ENCOUNTER — Other Ambulatory Visit: Payer: Self-pay

## 2020-05-12 DIAGNOSIS — Z1231 Encounter for screening mammogram for malignant neoplasm of breast: Secondary | ICD-10-CM | POA: Insufficient documentation

## 2020-12-22 ENCOUNTER — Emergency Department
Admission: EM | Admit: 2020-12-22 | Discharge: 2020-12-22 | Disposition: A | Discharge status: Home / Self Care | Payer: Medicare Other | Attending: Emergency Medicine | Admitting: Emergency Medicine

## 2020-12-22 ENCOUNTER — Other Ambulatory Visit: Payer: Self-pay

## 2020-12-22 DIAGNOSIS — Z794 Long term (current) use of insulin: Secondary | ICD-10-CM | POA: Diagnosis not present

## 2020-12-22 DIAGNOSIS — E119 Type 2 diabetes mellitus without complications: Secondary | ICD-10-CM | POA: Diagnosis not present

## 2020-12-22 DIAGNOSIS — I10 Essential (primary) hypertension: Secondary | ICD-10-CM | POA: Diagnosis not present

## 2020-12-22 DIAGNOSIS — Z7982 Long term (current) use of aspirin: Secondary | ICD-10-CM | POA: Diagnosis not present

## 2020-12-22 DIAGNOSIS — M5432 Sciatica, left side: Secondary | ICD-10-CM | POA: Diagnosis present

## 2020-12-22 DIAGNOSIS — M5410 Radiculopathy, site unspecified: Secondary | ICD-10-CM

## 2020-12-22 DIAGNOSIS — Z79899 Other long term (current) drug therapy: Secondary | ICD-10-CM | POA: Diagnosis not present

## 2020-12-22 MED ORDER — NAPROXEN 500 MG PO TABS: 500.0000 mg | ORAL_TABLET | Freq: Two times a day (BID) | ORAL | Status: DC

## 2020-12-22 MED ORDER — NAPROXEN 500 MG PO TABS
500.0000 mg | ORAL_TABLET | Freq: Once | ORAL | Status: AC
  Administered 2020-12-22: 500 mg via ORAL
  Filled 2020-12-22: qty 1

## 2020-12-22 MED ORDER — ORPHENADRINE CITRATE ER 100 MG PO TB12: 100.0000 mg | ORAL_TABLET | Freq: Two times a day (BID) | ORAL | 0 refills | Status: DC

## 2020-12-22 MED ORDER — CYCLOBENZAPRINE HCL 10 MG PO TABS
10.0000 mg | ORAL_TABLET | Freq: Once | ORAL | Status: AC
  Administered 2020-12-22: 10 mg via ORAL
  Filled 2020-12-22: qty 1

## 2020-12-22 NOTE — ED Provider Notes (Signed)
[facility] Emergency Department Provider Note   ____________________________________________   Event Date/Time   First MD Initiated Contact with Patient 12/22/20 [phone]     (approximate)  I have reviewed the triage vital signs and the nursing notes.   HISTORY  Via caregiver secondary to mental disability.  Chief Complaint Back Pain    HPI Kristina Foster is a 55 y.o. female patient complain of left lateral back pain for radicular component to the left lower extremity.  Patient denies bladder or bowel dysfunction.  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  Patient is status post lumbar fusion of L4 and L5, 2021.  Rates pain as a 10/10.  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   Thrombocytopenia (HCC) 03/18/2018   Syncope 12/08/2015   Lumbar spondylosis 08/02/2014    Past Surgical History:  Procedure Laterality Date   ABDOMINAL HYSTERECTOMY     BACK SURGERY     COLONOSCOPY WITH PROPOFOL N/A 11/11/2018   Procedure: COLONOSCOPY WITH PROPOFOL;  Surgeon: Earline Mayotte, MD;  Location: [facility] ENDOSCOPY;  Service: Endoscopy;  Laterality: N/A;   ESOPHAGOGASTRODUODENOSCOPY (EGD) WITH PROPOFOL N/A 11/11/2018   Procedure: ESOPHAGOGASTRODUODENOSCOPY (EGD) WITH PROPOFOL;  Surgeon: Earline Mayotte, MD;  Location: [facility] ENDOSCOPY;  Service: Endoscopy;  Laterality: N/A;    Prior to Admission medications   Medication Sig Start Date End Date Taking? Authorizing Provider  naproxen (NAPROSYN) 500 MG tablet Take 1 tablet (500 mg total)  by mouth 2 (two) times daily with a meal. 12/22/20  Yes Joni Reining, PA-C  orphenadrine (NORFLEX) 100 MG tablet Take 1 tablet (100 mg total) by mouth 2 (two) times daily. 12/22/20  Yes Joni Reining, PA-C  acetaminophen (TYLENOL) 650 MG suppository Place 650 mg rectally every 8 (eight) hours as needed.    [provider]  aspirin EC 81 MG tablet Take 81 mg by mouth daily.    [provider]  atorvastatin (LIPITOR) 10 MG tablet Take 10 mg by mouth daily.    [provider]  benztropine (COGENTIN) 2 MG tablet Take 2 mg by mouth 2 (two) times daily.    [provider]  colesevelam (WELCHOL) 625 MG tablet Take by mouth 3 (three) times daily with meals.     [provider]  estradiol (ESTRACE) 0.5 MG tablet Take 0.5 mg by mouth daily.    [provider]  furosemide (LASIX) 40 MG tablet Take 1 tablet by mouth daily. 11/08/15   [provider]  gabapentin (NEURONTIN) 100 MG capsule Take 100 mg by mouth daily.    [provider]  guaiFENesin-dextromethorphan (ROBITUSSIN DM) 100-10 MG/5ML syrup Take 5 mLs by mouth every 6 (six) hours as needed for cough.    [provider]  INGREZZA 40 MG CAPS Take 2 tablets by mouth daily. 11/09/15   [provider]  insulin glargine (LANTUS) 100 UNIT/ML injection Inject 35 Units into the skin at bedtime.  [provider]  lisinopril-hydrochlorothiazide (PRINZIDE,ZESTORETIC) 10-12.5 MG per tablet Take 1 tablet by mouth daily.    [provider]  magnesium hydroxide (MILK OF MAGNESIA) 400 MG/5ML suspension Take by mouth daily as needed for mild constipation.    [provider]  metaxalone (SKELAXIN) 800 MG tablet Take 0.5 tablets (400 mg total) by mouth 3 (three) times daily as needed for muscle spasms. 03/09/18   Triplett, Rulon Eisenmenger B, FNP  metoprolol succinate (TOPROL-XL) 100 MG 24 hr tablet Take 200 mg by mouth daily.     [provider]  NOVOLOG  MIX 70/30 FLEXPEN (70-30) 100 UNIT/ML FlexPen Inject 35 Units into the skin daily with breakfast.  11/15/15   [provider]  polyethylene glycol (MIRALAX / GLYCOLAX) 17 g packet Take 17 g by mouth daily.    [provider]  potassium chloride SA (K-DUR,KLOR-CON) 20 MEQ tablet Take 1 tablet by mouth daily. 11/08/15   [provider]  risperiDONE (RISPERDAL) 3 MG tablet Take 3 mg by mouth at bedtime.     [provider]  traMADol (ULTRAM) 50 MG tablet Take 1 tablet (50 mg total) by mouth every 6 (six) hours as needed. 03/17/18   Cuthriell, Delorise Royals, PA-C  TRULICITY 0.75 MG/0.5ML SOPN Inject 0.5 mLs into the skin once a week. mondays 11/10/15   [provider]  venlafaxine (EFFEXOR) 75 MG tablet Take 75 mg by mouth 2 (two) times daily.    [provider]    Allergies Patient has no known allergies.  Family History  Problem Relation Age of Onset   Breast cancer Neg Hx     Social History Social History   Tobacco Use   Smoking status: Never   Smokeless tobacco: Never  Vaping Use   Vaping Use: Never used  Substance Use Topics   Alcohol use: No   Drug use: No    Review of Systems Constitutional: No fever/chills Eyes: No visual changes. ENT: No sore throat. Cardiovascular: Denies chest pain. Respiratory: Denies shortness of breath. Gastrointestinal: No abdominal pain.  No nausea, no vomiting.  No diarrhea.  No constipation. Genitourinary: Negative for dysuria. Musculoskeletal: Negative for back pain. Skin: Negative for rash. Neurological: Negative for headaches, focal weakness or numbness. Psychiatric: Anxiety, depression, and schizophrenia.   Endocrine: Diabetes, hyperlipidemia, hypertension.   ____________________________________________   PHYSICAL EXAM:  VITAL SIGNS: ED Triage Vitals  Enc Vitals Group     BP 12/22/20 0756 111/76     Pulse Rate 12/22/20 0756 87     Resp 12/22/20 0756 17     Temp 12/22/20 0756  97.7 F (36.5 C)     Temp Source 12/22/20 0756 Oral     SpO2 12/22/20 0756 96 %     Weight 12/22/20 0758 189 lb (85.7 kg)     Height --      Head Circumference --      Peak Flow --      Pain Score 12/22/20 0758 10     Pain Loc --      Pain Edu? --      Excl. in GC? --     Constitutional: Alert and oriented. Well appearing and in no acute distress. Eyes: Conjunctivae are normal. PERRL. EOMI. Head: Atraumatic. Nose: No congestion/rhinnorhea. Mouth/Throat: Mucous membranes are moist.  Oropharynx non-erythematous. Neck: No stridor.  No cervical spine tenderness to palpation. Hematological/Lymphatic/Immunilogical: No cervical lymphadenopathy. Cardiovascular: Normal rate, regular rhythm. Grossly normal heart sounds.  Good peripheral circulation. Respiratory: Normal respiratory effort.  No retractions. Lungs CTAB. Gastrointestinal: Soft and nontender. No distention. No abdominal bruits. No CVA tenderness. Genitourinary: Deferred Musculoskeletal: No obvious lumbar spine deformity.  No guarding with palpation spinal processes.  Patient has moderate guarding palpation left paraspinal muscle area.   Neurologic:  Normal speech and language. No gross focal neurologic deficits are appreciated. No gait instability. Skin:  Skin is warm, dry and intact. No rash noted. Psychiatric: Mood and affect are normal. Speech and behavior are normal.  ____________________________________________   LABS (all labs ordered are listed, but only abnormal results are displayed)  Labs Reviewed - No data to display ____________________________________________  EKG   ____________________________________________  RADIOLOGY I, Joni Reining, personally viewed and evaluated these images (plain radiographs) as part of my medical decision making, as well as reviewing the written report by the radiologist.  ED MD interpretation:    Official radiology report(s): No results  found.  ____________________________________________   PROCEDURES  Procedure(s) performed (including Critical Care):  Procedures   ____________________________________________   INITIAL IMPRESSION / ASSESSMENT AND PLAN / ED COURSE  As part of my medical decision making, I reviewed the following data within the electronic MEDICAL RECORD NUMBER       Patient presents with left lateral back pain with radicular points to the left lower extremity.  Patient complaint physical exam consistent with radicular back pain.  Discussed rationale for conservative treatment this time consistent muscle relaxer and anti-inflammatory medications.  Patient vies follow-up PCP if there is no improvement in 3 to 5 days.  Return back to ED if condition worsen.       ____________________________________________   FINAL CLINICAL IMPRESSION(S) / ED DIAGNOSES  Final diagnoses:  Radicular low back pain  Sciatica of left side     ED Discharge Orders          Ordered    naproxen (NAPROSYN) 500 MG tablet  2 times daily with meals        12/22/20 1003    orphenadrine (NORFLEX) 100 MG tablet  2 times daily        12/22/20 1003             Note:  This document was prepared using Dragon voice recognition software and may include unintentional dictation errors.    Joni Reining, PA-C 12/22/20 1004    Delton Prairie, MD 12/22/20 [phone]

## 2020-12-22 NOTE — ED Triage Notes (Signed)
Pt c/o lower back pain radiating into the left leg for the past couple of days, here with care giver.

## 2020-12-22 NOTE — Discharge Instructions (Addendum)
Read and follow discharge care instructions.  Take medication as directed follow-up PCP if no improvement in 3 to 5 days.

## 2020-12-22 NOTE — ED Notes (Signed)
Pt called x 2 with no answer  

## 2021-04-24 ENCOUNTER — Other Ambulatory Visit: Payer: Self-pay | Admitting: Family Medicine

## 2021-04-24 DIAGNOSIS — Z1231 Encounter for screening mammogram for malignant neoplasm of breast: Secondary | ICD-10-CM

## 2021-05-31 ENCOUNTER — Ambulatory Visit
Admission: RE | Admit: 2021-05-31 | Discharge: 2021-05-31 | Disposition: A | Discharge status: Home / Self Care | Payer: Medicare Other | Source: Ambulatory Visit | Attending: Family Medicine | Admitting: Family Medicine

## 2021-05-31 DIAGNOSIS — Z1231 Encounter for screening mammogram for malignant neoplasm of breast: Secondary | ICD-10-CM | POA: Diagnosis present

## 2021-07-16 ENCOUNTER — Other Ambulatory Visit: Payer: Self-pay

## 2021-07-16 ENCOUNTER — Emergency Department
Admission: EM | Admit: 2021-07-16 | Discharge: 2021-07-16 | Disposition: A | Discharge status: Home / Self Care | Payer: Medicare Other | Attending: Emergency Medicine | Admitting: Emergency Medicine

## 2021-07-16 ENCOUNTER — Emergency Department: Payer: Medicare Other

## 2021-07-16 ENCOUNTER — Encounter: Payer: Self-pay | Admitting: Emergency Medicine

## 2021-07-16 DIAGNOSIS — W108XXA Fall (on) (from) other stairs and steps, initial encounter: Secondary | ICD-10-CM | POA: Insufficient documentation

## 2021-07-16 DIAGNOSIS — W19XXXA Unspecified fall, initial encounter: Secondary | ICD-10-CM

## 2021-07-16 DIAGNOSIS — S3992XA Unspecified injury of lower back, initial encounter: Secondary | ICD-10-CM | POA: Diagnosis present

## 2021-07-16 DIAGNOSIS — S300XXA Contusion of lower back and pelvis, initial encounter: Secondary | ICD-10-CM | POA: Diagnosis not present

## 2021-07-16 MED ORDER — PREDNISONE 10 MG PO TABS: 10.0000 mg | ORAL_TABLET | ORAL | 0 refills | Status: DC

## 2021-07-16 NOTE — ED Triage Notes (Signed)
Pt to ED via POV, pt is having back pain. Pt fell down 2 steps yesterday but her back was hurting before then. She saw her PCP on Thursday and was given medication for the pain but they did not help. Pt has hx/o back surgery several years ago.

## 2021-08-13 ENCOUNTER — Other Ambulatory Visit: Payer: Self-pay

## 2021-08-13 ENCOUNTER — Emergency Department
Admission: EM | Admit: 2021-08-13 | Discharge: 2021-08-13 | Disposition: A | Discharge status: Home / Self Care | Payer: Medicare Other | Attending: Emergency Medicine | Admitting: Emergency Medicine

## 2021-08-13 ENCOUNTER — Emergency Department: Payer: Medicare Other

## 2021-08-13 DIAGNOSIS — Y92199 Unspecified place in other specified residential institution as the place of occurrence of the external cause: Secondary | ICD-10-CM | POA: Insufficient documentation

## 2021-08-13 DIAGNOSIS — S39012A Strain of muscle, fascia and tendon of lower back, initial encounter: Secondary | ICD-10-CM | POA: Diagnosis not present

## 2021-08-13 DIAGNOSIS — W01198A Fall on same level from slipping, tripping and stumbling with subsequent striking against other object, initial encounter: Secondary | ICD-10-CM | POA: Insufficient documentation

## 2021-08-13 DIAGNOSIS — W19XXXA Unspecified fall, initial encounter: Secondary | ICD-10-CM

## 2021-08-13 DIAGNOSIS — S3992XA Unspecified injury of lower back, initial encounter: Secondary | ICD-10-CM | POA: Diagnosis present

## 2021-08-13 DIAGNOSIS — M25569 Pain in unspecified knee: Secondary | ICD-10-CM | POA: Insufficient documentation

## 2021-08-13 MED ORDER — LIDOCAINE 5 % EX PTCH
1.0000 | MEDICATED_PATCH | CUTANEOUS | Status: DC
  Administered 2021-08-13: 1 via TRANSDERMAL
  Filled 2021-08-13: qty 1

## 2021-08-13 MED ORDER — LIDOCAINE 5 % EX PTCH: 1.0000 | MEDICATED_PATCH | Freq: Two times a day (BID) | CUTANEOUS | 0 refills | Status: AC

## 2021-08-13 NOTE — ED Triage Notes (Signed)
Pt to ED via POV from home. Pt ambulatory to triage room. Pt had unwitnessed fall on Friday. Pt fell forward onto her knees. Denies head trauma. Denies blood thinners. Pt has been falling more frequently and family wants her evaluated. Pt hx back surgery several years ago.   PA in triage room to assess pt

## 2021-08-13 NOTE — ED Provider Triage Note (Signed)
Emergency Medicine Provider Triage Evaluation Note  Kristina Foster , a 56 y.o. female  was evaluated in triage.  Pt complains of fall 3 days ago and landed on her knees going up steps.  No head injury or LOC.  Was seen in ER recently and had CT Lumbar spine on 07/16/21 for a fall backwards.  Patient currently on tapering  dose of prednisone.    Review of Systems  Positive: Knee pain, "tail bone" pain Negative: No head injury, No nausea, or vomiting  Physical Exam  BP (!) 157/94 (BP Location: Left Arm)   Pulse 75   Temp 98.2 F (36.8 C) (Oral)   Resp 16   Ht 5\' 2"  (1.575 m)   Wt 83.9 kg   SpO2 93%   BMI 33.84 kg/m  Gen:   Awake, no distress  minimal verbal responses Resp:  Normal effort  MSK:   Moves extremities without difficulty.  No edema or effusion bilateral knees.  Patient able to stand without assistance.  Points to coccyx area when c/o pain.    No abrasions noted.  Other:    Medical Decision Making  Medically screening exam initiated at 12:04 PM.  Appropriate orders placed.  Kristina Foster was informed that the remainder of the evaluation will be completed by another provider, this initial triage assessment does not replace that evaluation, and the importance of remaining in the ED until their evaluation is complete.     Willaim Bane, PA-C 08/13/21 1219

## 2021-08-13 NOTE — Discharge Instructions (Addendum)
Continue taking Tylenol 1 g every 8 hours and use the lidocaine patches on your back to help with pain.  You need to avoid ibuprofen due to her kidney disease.  Call your primary care doctor to follow-up for seeing if she can get some physical therapy to help with her back pain.  Return to the ER if she develops fevers, worsening pain or any other concerns

## 2021-08-13 NOTE — ED Notes (Signed)
Patient discharged to home per MD order. Patient in stable condition, and deemed medically cleared by ED provider for discharge. Discharge instructions reviewed with patient/family using "Teach Back"; verbalized understanding of medication education and administration, and information about follow-up care. Denies further concerns. ° °

## 2021-08-13 NOTE — ED Notes (Signed)
See triage note  Presents with care giver s/p fall on Friday.  States she fell forward   Having some pain top back and knees

## 2021-08-13 NOTE — ED Provider Notes (Addendum)
[facility] Provider Note    Event Date/Time   First MD Initiated Contact with Patient 08/13/21 1411     (approximate)   History   Fall   HPI  Kristina Foster is a 56 y.o. female  with intellectual disability has been at a group home for most of her life who comes in for a fall.  Patient resides prevoiously with [name] at a group home She is been making medical decisions for her over the past 30 years but it is a friend.  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  She reports having a fall 1 month ago where she was seen and had a CT scan done without any evidence of acute fracture.  She reports some intermittent continued pain of the back since this fall but then on Friday she was walking and she fell forward and hit her knees and has been complaining of some lower back pain and some knee pain since then.  She has been able to ambulate but having difficulty sleeping at night due to the pain.  They are adamant that she did not hit her head denies any cervical neck pain.  She is otherwise been acting her normal self eating and drinking normally.  They deny any new numbness or weakness in the leg.  Denies any rectal incontinence or stool incontinence.  Physical Exam   Triage Vital Signs: ED Triage Vitals [08/13/21 1156]  Enc Vitals Group     BP (!) 157/94     Pulse Rate 75     Resp 16     Temp 98.2 F (36.8 C)     Temp Source Oral     SpO2 93 %     Weight 185 lb (83.9 kg)     Height 5\' 2"  (1.575 m)     Head Circumference      Peak Flow      Pain Score      Pain Loc      Pain Edu?      Excl. in GC?     Most recent vital signs: Vitals:   08/13/21 1156  BP: (!) 157/94  Pulse: 75  Resp: 16  Temp: 98.2 F (36.8 C)  SpO2: 93%     General: Awake, no distress.  CV:  Good peripheral perfusion.  Resp:  Normal effort.  Abd:  No distention.  Other:  Patient has old scar  noted on her lumbar area with a little bit of low flank tenderness but no significant L-spine tenderness or step-offs noted.  She is able to ambulate fully and has good plantarflexion dorsiflexion of her ankles.  She has normal sensation in her legs.  Patient has good equal strength in both legs.  Patient is ambulatory without any issues   ED Results / Procedures / Treatments   Labs (all labs ordered are listed, but only abnormal results are displayed) Labs Reviewed - No data to display  I reviewed the blood work from [facility] on 4/14 with normal hemoglobin and creatinine of 1.45   RADIOLOGY I have reviewed the xray personally and interpreted and do not see any evidence of acute fracture  PROCEDURES:  Critical Care performed: No  Procedures   MEDICATIONS ORDERED IN ED: Medications - No data to display   IMPRESSION / MDM / ASSESSMENT AND PLAN / ED COURSE  I reviewed the triage vital  signs and the nursing notes.   Patient's presentation is most consistent with acute, uncomplicated illness.   Differential includes lumbar strain.  We considered x-rays or CTs to further evaluate the lumbar area but she already had an x-ray ordered from triage which was negative for the sacrum or coccyx fracture.  Given she did not fall onto her back they are declining imaging at this time given low suspicion for acute fracture.  She reports the pain in her back is the same pain that she has had since June.  Suspect this is more likely a lumbar strain given her prior fall a month ago.  She had a CT scan at that time without evidence of fracture.  No symptoms to suggest cord compression.  Offered CT imaging of the head and neck but they are adamant that she did not hit her head or neck and that she fell directly onto her knees.  We discussed blood work but given she is otherwise acting her normal self and this was a mechanical fall we have opted to not do that today.  They are mostly here just to try to get  something to help with the pain.  We discussed treatment with Tylenol, lidocaine patches.  We have opted to hold off on opioids at this time due to the risk for them.  She is going to try heating pads and follow-up with her primary care doctor to get a physical therapy evaluation.  Patient has been ambulatory around the room with me and feels steady on her feet.  They feel comfortable with discharge home and follow-up with primary doctor.  02 was charted at 65--- it was rechecked at 96% and she had no cp or sob.     FINAL CLINICAL IMPRESSION(S) / ED DIAGNOSES   Final diagnoses:  Fall, initial encounter  Strain of lumbar region, initial encounter     Rx / DC Orders   ED Discharge Orders          Ordered    lidocaine (LIDODERM) 5 %  Every 12 hours        08/13/21 1453             Note:  This document was prepared using Dragon voice recognition software and may include unintentional dictation errors.   Concha Se, MD 08/13/21 1455    Concha Se, MD 08/13/21 1524    Concha Se, MD 08/13/21 [phone]

## 2021-08-20 ENCOUNTER — Emergency Department
Admission: EM | Admit: 2021-08-20 | Discharge: 2021-08-20 | Disposition: A | Discharge status: Home / Self Care | Payer: Medicare Other | Attending: Emergency Medicine | Admitting: Emergency Medicine

## 2021-08-20 ENCOUNTER — Other Ambulatory Visit: Payer: Self-pay

## 2021-08-20 ENCOUNTER — Emergency Department: Payer: Medicare Other

## 2021-08-20 ENCOUNTER — Encounter: Payer: Self-pay | Admitting: Emergency Medicine

## 2021-08-20 DIAGNOSIS — M545 Low back pain, unspecified: Secondary | ICD-10-CM | POA: Diagnosis present

## 2021-08-20 DIAGNOSIS — M5126 Other intervertebral disc displacement, lumbar region: Secondary | ICD-10-CM | POA: Diagnosis not present

## 2021-08-20 DIAGNOSIS — W010XXA Fall on same level from slipping, tripping and stumbling without subsequent striking against object, initial encounter: Secondary | ICD-10-CM | POA: Diagnosis not present

## 2021-08-20 LAB — CBC WITH DIFFERENTIAL/PLATELET
Abs Immature Granulocytes: 0.33 10*3/uL — ABNORMAL HIGH (ref 0.00–0.07)
Basophils Absolute: 0.1 10*3/uL (ref 0.0–0.1)
Basophils Relative: 1 %
Eosinophils Absolute: 0.2 10*3/uL (ref 0.0–0.5)
Eosinophils Relative: 2 %
HCT: 35.3 % — ABNORMAL LOW (ref 36.0–46.0)
Hemoglobin: 11.9 g/dL — ABNORMAL LOW (ref 12.0–15.0)
Immature Granulocytes: 3 %
Lymphocytes Relative: 21 %
Lymphs Abs: 2.1 10*3/uL (ref 0.7–4.0)
MCH: 30.6 pg (ref 26.0–34.0)
MCHC: 33.7 g/dL (ref 30.0–36.0)
MCV: 90.7 fL (ref 80.0–100.0)
Monocytes Absolute: 1 10*3/uL (ref 0.1–1.0)
Monocytes Relative: 10 %
Neutro Abs: 6.1 10*3/uL (ref 1.7–7.7)
Neutrophils Relative %: 63 %
Platelets: 210 10*3/uL (ref 150–400)
RBC: 3.89 MIL/uL (ref 3.87–5.11)
RDW: 12.1 % (ref 11.5–15.5)
WBC: 9.8 10*3/uL (ref 4.0–10.5)
nRBC: 0 % (ref 0.0–0.2)

## 2021-08-20 LAB — BASIC METABOLIC PANEL
Anion gap: 9 (ref 5–15)
BUN: 20 mg/dL (ref 6–20)
CO2: 27 mmol/L (ref 22–32)
Calcium: 9.6 mg/dL (ref 8.9–10.3)
Chloride: 105 mmol/L (ref 98–111)
Creatinine, Ser: 1.26 mg/dL — ABNORMAL HIGH (ref 0.44–1.00)
GFR, Estimated: 50 mL/min — ABNORMAL LOW (ref >60)
Glucose, Bld: 74 mg/dL (ref 70–99)
Potassium: 3.7 mmol/L (ref 3.5–5.1)
Sodium: 141 mmol/L (ref 135–145)

## 2021-08-20 LAB — URINALYSIS, ROUTINE W REFLEX MICROSCOPIC
Bacteria, UA: NONE SEEN
Bilirubin Urine: NEGATIVE
Glucose, UA: >=500 mg/dL — AB
Hgb urine dipstick: NEGATIVE
Ketones, ur: NEGATIVE mg/dL
Leukocytes,Ua: NEGATIVE
Nitrite: NEGATIVE
Protein, ur: NEGATIVE mg/dL
Specific Gravity, Urine: 1.009 (ref 1.005–1.030)
pH: 5 (ref 5.0–8.0)

## 2021-08-20 MED ORDER — LIDOCAINE 5 % EX PTCH
1.0000 | MEDICATED_PATCH | CUTANEOUS | Status: DC
  Administered 2021-08-20: 1 via TRANSDERMAL
  Filled 2021-08-20: qty 1

## 2021-08-20 MED ORDER — PREDNISONE 10 MG (21) PO TBPK: ORAL_TABLET | ORAL | 0 refills | Status: DC

## 2021-08-20 MED ORDER — ACETAMINOPHEN 325 MG PO TABS
650.0000 mg | ORAL_TABLET | Freq: Once | ORAL | Status: AC
  Administered 2021-08-20: 650 mg via ORAL
  Filled 2021-08-20: qty 2

## 2021-08-20 NOTE — ED Triage Notes (Signed)
Larey Seat last week and seen here.  Back pain.  Says fell again since t hen.

## 2021-08-20 NOTE — Discharge Instructions (Signed)
Please follow-up with the neurosurgeon regarding your disc herniation.  His office should call you to arrange an appointment.  Please return for any new, worsening, or change in symptoms or other concerns including worsening weakness, accidentally urinating or pooping on yourself without realizing it, numbness, or any other concerns.  Please take the medications as prescribed.

## 2021-08-20 NOTE — ED Provider Notes (Signed)
[facility] Provider Note    Event Date/Time   First MD Initiated Contact with Patient 08/20/21 1208     (approximate)   History   Back Pain   HPI  Kristina Foster is a 56 y.o. female who presents today for evaluation of back pain after a fall.  Patient is at a group home.  Patient had a fall approximately 1 month ago and another fall approximately 1 week ago.  Patient reports that both falls were in the setting of a trip and fall.  She reports that she has had continued pain since her original fall, but did not get repeat imaging during her second visit and she is wondering if she should have.  She reports that her pain radiates down the lateral aspect of both thighs, though L>R.  She does not have any urinary or fecal incontinence or retention.  She does not feel like her legs are particularly weak and she has still been able to walk.  No fevers or chills.  No abdominal pain.  I reviewed the patient's chart.  Patient had a fall on 07/16/2021 where she tripped on the stairs and landed on her buttocks.  She had a CT scan at that time which demonstrated no acute findings.  She was seen again on 7/24 after she had a fall again while walking and fell forward and struck her knees.  She had an x-ray at that time which demonstrated no fracture.  Also declined blood work at that time.  Patient Active Problem List   Diagnosis Date Noted   Chronic ITP (idiopathic thrombocytopenia) (HCC) 04/20/2019   Thrombocytopenia (HCC) 03/18/2018   Syncope 12/08/2015   Lumbar spondylosis 08/02/2014          Physical Exam   Triage Vital Signs: ED Triage Vitals  Enc Vitals Group     BP 08/20/21 1152 (!) 151/90     Pulse Rate 08/20/21 1152 75     Resp 08/20/21 1152 18     Temp 08/20/21 1152 97.6 F (36.4 C)     Temp Source 08/20/21 1152 Oral     SpO2 08/20/21 1152 95 %     Weight 08/20/21 1203 184 lb 15.5 oz (83.9 kg)     Height 08/20/21 1203 5\' 2"  (1.575 m)     Head  Circumference --      Peak Flow --      Pain Score 08/20/21 1143 8     Pain Loc --      Pain Edu? --      Excl. in GC? --     Most recent vital signs: Vitals:   08/20/21 1152 08/20/21 1711  BP: (!) 151/90 (!) 155/77  Pulse: 75 87  Resp: 18 17  Temp: 97.6 F (36.4 C) 98.3 F (36.8 C)  SpO2: 95% 99%    Physical Exam Vitals and nursing note reviewed.  Constitutional:      General: Awake and alert. No acute distress.    Appearance: Normal appearance. The patient is overweight.  HENT:     Head: Normocephalic and atraumatic.     Mouth: Mucous membranes are moist.  Eyes:     General: PERRL. Normal EOMs        Right eye: No discharge.        Left eye: No discharge.     Conjunctiva/sclera: Conjunctivae normal.  Cardiovascular:     Rate and Rhythm: Normal rate and regular rhythm.     Pulses:  Normal pulses.     Heart sounds: Normal heart sounds Pulmonary:     Effort: Pulmonary effort is normal. No respiratory distress.     Breath sounds: Normal breath sounds.  Abdominal:     Abdomen is soft. There is no abdominal tenderness. No rebound or guarding. No distention. Musculoskeletal:        General: No swelling. Normal range of motion.     Cervical back: Normal range of motion and neck supple.  Back: Mild tenderness along the lumbar area including midline and paraspinally.  Strength and sensation 5/5 to bilateral lower extremities. Normal great toe extension against resistance. Normal sensation throughout feet. Normal patellar reflexes.  Positive SLR and opposite SLR bilaterally.  She is able to lift up both legs actively and hold up against resistance Skin:    General: Skin is warm and dry.     Capillary Refill: Capillary refill takes less than 2 seconds.     Findings: No rash.  Neurological:     Mental Status: The patient is awake and alert.      ED Results / Procedures / Treatments   Labs (all labs ordered are listed, but only abnormal results are displayed) Labs  Reviewed  CBC WITH DIFFERENTIAL/PLATELET - Abnormal; Notable for the following components:      Result Value   Hemoglobin 11.9 (*)    HCT 35.3 (*)    Abs Immature Granulocytes 0.33 (*)    All other components within normal limits  BASIC METABOLIC PANEL - Abnormal; Notable for the following components:   Creatinine, Ser 1.26 (*)    GFR, Estimated 50 (*)    All other components within normal limits  URINALYSIS, ROUTINE W REFLEX MICROSCOPIC - Abnormal; Notable for the following components:   Color, Urine STRAW (*)    APPearance CLEAR (*)    Glucose, UA >=500 (*)    All other components within normal limits     EKG     RADIOLOGY I independently reviewed and interpreted imaging and agree with radiologists findings.     PROCEDURES:  Critical Care performed:   Procedures   MEDICATIONS ORDERED IN ED: Medications  lidocaine (LIDODERM) 5 % 1 patch (1 patch Transdermal Patch Applied 08/20/21 1700)  acetaminophen (TYLENOL) tablet 650 mg (650 mg Oral Given 08/20/21 1659)     IMPRESSION / MDM / ASSESSMENT AND PLAN / ED COURSE  I reviewed the triage vital signs and the nursing notes.   Differential diagnosis includes, but is not limited to, compression fracture, lumbar radiculopathy/disc herniation, cord compression, cauda equina, contusion, lumbar strain.  Patient is awake and alert, hemodynamically stable and afebrile.  She has normal strength and sensation in bilateral lower extremities.  She is able to ambulate unassisted.  She had a postvoid residual of 0, not retaining urine.  No saddle anesthesia or reports of urinary/fecal incontinence or retention.  She does have tenderness diffusely across her low back.  She is in a group home, and given that she had recent fall, and continued pain, CT scan was obtained for evaluation of missed injury.  CT scan demonstrates a suspected right L3-L4 subarticular recess stenosis with potentially moderate canal stenosis.  Therefore MRI was  obtained for further evaluation.  MRI reveals a new broad-based disc herniation at L2-L3 with extruded disc material migrating upwards in the left lateral recess behind L2 with marked deformity of the thecal sac and likely neural compression at this level particularly on the left which is the side that the  patient has more pain on.  I discussed these findings with neurosurgery on-call, Dr. Katrinka Blazing, who feels that given that she has no focal neurological deficits at this time and is able to ambulate without bowel bladder incontinence or retention, we should trial steroids and outpatient management.  I discussed this with the patient and her caretaker who understand and agree.  We discussed strict return precautions as well as the importance of close outpatient follow-up.  We discussed at length neurological findings that would warrant prompt return to the emergency department.  Patient and caretaker demonstrate understanding.  Patient was discharged in stable condition.  All questions answered.   Patient's presentation is most consistent with acute presentation with potential threat to life or bodily function.     FINAL CLINICAL IMPRESSION(S) / ED DIAGNOSES   Final diagnoses:  Lumbar disc herniation     Rx / DC Orders   ED Discharge Orders          Ordered    predniSONE (STERAPRED UNI-PAK 21 TAB) 10 MG (21) TBPK tablet        08/20/21 1708             Note:  This document was prepared using Dragon voice recognition software and may include unintentional dictation errors.   Keturah Shavers 08/20/21 1940    Minna Antis, MD 08/21/21 Jerene Bears

## 2021-08-29 ENCOUNTER — Telehealth: Payer: Self-pay

## 2021-08-29 NOTE — Telephone Encounter (Signed)
Kristina Foster confirmed appt for 09/20/2021.

## 2021-08-29 NOTE — Telephone Encounter (Signed)
Left message with [name] to have pt call the office.

## 2021-08-29 NOTE — Telephone Encounter (Signed)
The ER contacted Dr Katrinka Blazing about this patient when he was covering for Dr Myer Haff on 08/20/21.   She can have a new patient appointment with Duwayne Heck or Kennyth Arnold for her back pain if she is still having pain.

## 2021-09-20 ENCOUNTER — Ambulatory Visit (INDEPENDENT_AMBULATORY_CARE_PROVIDER_SITE_OTHER): Payer: Medicare Other | Admitting: Orthopedic Surgery

## 2021-09-20 ENCOUNTER — Encounter: Payer: Self-pay | Admitting: Neurosurgery

## 2021-09-20 VITALS — BP 132/82 | Ht 62.0 in | Wt 185.0 lb

## 2021-09-20 DIAGNOSIS — G8929 Other chronic pain: Secondary | ICD-10-CM | POA: Diagnosis not present

## 2021-09-20 DIAGNOSIS — M5441 Lumbago with sciatica, right side: Secondary | ICD-10-CM | POA: Diagnosis not present

## 2021-09-20 DIAGNOSIS — M5442 Lumbago with sciatica, left side: Secondary | ICD-10-CM

## 2021-09-20 DIAGNOSIS — M5416 Radiculopathy, lumbar region: Secondary | ICD-10-CM | POA: Diagnosis not present

## 2021-09-20 DIAGNOSIS — Z981 Arthrodesis status: Secondary | ICD-10-CM

## 2021-09-20 NOTE — Patient Instructions (Signed)
It was so nice to see you today, I am sorry that you are hurting so much.   Your lower back MRI showed that your fusion at L4-L5 looks good. You have some wear and tear and disc bulging above and below the fusion that may be contributing to your pain.    I sent physical therapy orders to home health, they should call you to schedule.   Contact your PCP about home health nurse and aide.   Continue on over the counter tylenol as directed. Take with food. Okay to continue on gabapentin as well. Take as prescribed.   I want you to set you up for possible lower back injections with Dr. Mariah Milling. They should call you in the next week.   I will see you back in 2 months. Please do not hesitate to call if you have any questions or concerns. You can also message me in MyChart.   If you have not heard back about any of the tests/procedures in the next week, please call the office so we can help you get these things scheduled.   Drake Leach PA-C [phone]

## 2021-09-20 NOTE — Progress Notes (Signed)
Referring Physician:  Minna Antis, MD [id number] [place],  [place] [address]  Primary Physician:  Kristina Gang, FNP  History of Present Illness: 09/20/2021  History of chronic ITP. History of lumbar fusion L4-L5 in 2020.  She has history of intellectual disability. Lives with her friend Kristina Foster (who helps with her history).   Seen in ED on 08/20/21 with increased LBP x 2 falls (a month ago and then a week prior to being seen). She has LBP with bilateral leg pain in lateral thigh to her knee, left > right.   MRI in ED showed disc at L3-L4 with extruded disc behind L2. She was given 21 day prednisone pack from ED. Given lidoderm patches at a previous visit.   Kristina Foster is here today with a chief complaint of 2-3 month history of constant LBP with constant bilateral leg pain (entire leg) to her feet. LBP = leg pain. Right leg pain = left leg pain. No specific aggravating factors. She has pain all the time. Some relief with tylenol. Overall, she is sleeping okay. She rates her pain as an 8 on a scale of 1-10. Has diffuse numbness and tingling in her legs. No weakness noted.   No relief with prednisone from ED. She is taking neurontin and tylenol.   No recent PT. No injections. Previous back surgery as above.   Bowel/Bladder Dysfunction: none  Conservative measures:  Physical therapy: no recent  Multimodal medical therapy including regular antiinflammatories: prednisone, neurontin, tylenol, lidoderm patches, mobic Injections: no recent epidural steroid injections  Past Surgery: lumbar fusion L4-L5 2020  Kristina Foster has no symptoms of cervical myelopathy.  The symptoms are causing a significant impact on the patient's life.   Review of Systems:  A 10 point review of systems is negative, except for the pertinent positives and negatives detailed in the HPI.  Past Medical History: Past Medical History:  Diagnosis Date   Anxiety    Arthritis     Constipation    Depression    Diabetes mellitus without complication (HCC)    Type 2    Dizziness    Hyperlipemia    Hypertension    Mental disability    Schizo affective schizophrenia (HCC)     Past Surgical History: Past Surgical History:  Procedure Laterality Date   ABDOMINAL HYSTERECTOMY     BACK SURGERY     COLONOSCOPY WITH PROPOFOL N/A 11/11/2018   Procedure: COLONOSCOPY WITH PROPOFOL;  Surgeon: Earline Mayotte, MD;  Location: [facility] ENDOSCOPY;  Service: Endoscopy;  Laterality: N/A;   ESOPHAGOGASTRODUODENOSCOPY (EGD) WITH PROPOFOL N/A 11/11/2018   Procedure: ESOPHAGOGASTRODUODENOSCOPY (EGD) WITH PROPOFOL;  Surgeon: Earline Mayotte, MD;  Location: [facility] ENDOSCOPY;  Service: Endoscopy;  Laterality: N/A;    Allergies: Allergies as of 09/20/2021   (No Known Allergies)    Medications: Outpatient Encounter Medications as of 09/20/2021  Medication Sig   acetaminophen (TYLENOL) 650 MG suppository Place 650 mg rectally every 8 (eight) hours as needed.   aspirin EC 81 MG tablet Take 81 mg by mouth daily.   atorvastatin (LIPITOR) 10 MG tablet Take 10 mg by mouth daily.   benztropine (COGENTIN) 2 MG tablet Take 2 mg by mouth 2 (two) times daily.   colesevelam (WELCHOL) 625 MG tablet Take by mouth 3 (three) times daily with meals.    estradiol (ESTRACE) 0.5 MG tablet Take 0.5 mg by mouth daily.   furosemide (LASIX) 40 MG tablet Take 1 tablet by mouth daily.  gabapentin (NEURONTIN) 100 MG capsule Take 100 mg by mouth daily.   guaiFENesin-dextromethorphan (ROBITUSSIN DM) 100-10 MG/5ML syrup Take 5 mLs by mouth every 6 (six) hours as needed for cough.   INGREZZA 40 MG CAPS Take 2 tablets by mouth daily.   insulin glargine (LANTUS) 100 UNIT/ML injection Inject 35 Units into the skin at bedtime.    lisinopril-hydrochlorothiazide (PRINZIDE,ZESTORETIC) 10-12.5 MG per tablet Take 1 tablet by mouth daily.   magnesium hydroxide (MILK OF MAGNESIA) 400 MG/5ML suspension Take by mouth daily  as needed for mild constipation.   metoprolol succinate (TOPROL-XL) 100 MG 24 hr tablet Take 200 mg by mouth daily.    NOVOLOG MIX 70/30 FLEXPEN (70-30) 100 UNIT/ML FlexPen Inject 35 Units into the skin daily with breakfast.    polyethylene glycol (MIRALAX / GLYCOLAX) 17 g packet Take 17 g by mouth daily.   potassium chloride SA (K-DUR,KLOR-CON) 20 MEQ tablet Take 1 tablet by mouth daily.   predniSONE (DELTASONE) 10 MG tablet Take 1 tablet (10 mg total) by mouth as directed.   predniSONE (STERAPRED UNI-PAK 21 TAB) 10 MG (21) TBPK tablet Please take 6 tablets on day 1, and decrease by 1 tablet for each subsequent day   risperiDONE (RISPERDAL) 3 MG tablet Take 3 mg by mouth at bedtime.    TRULICITY 0.75 MG/0.5ML SOPN Inject 0.5 mLs into the skin once a week. mondays   venlafaxine (EFFEXOR) 75 MG tablet Take 75 mg by mouth 2 (two) times daily.   No facility-administered encounter medications on file as of 09/20/2021.    Social History: Social History   Tobacco Use   Smoking status: Never   Smokeless tobacco: Never  Vaping Use   Vaping Use: Never used  Substance Use Topics   Alcohol use: No   Drug use: No    Family Medical History: Family History  Problem Relation Age of Onset   Breast cancer Neg Hx     Physical Examination: There were no vitals filed for this visit.  General: Patient is well developed, well nourished, calm, collected, and in no apparent distress. Attention to examination is appropriate.  Respiratory: Patient is breathing without any difficulty.   NEUROLOGICAL:     Awake, alert, oriented to person, place, and time.  Speech is clear and fluent. Fund of knowledge is appropriate.   Cranial Nerves: Pupils equal round and reactive to light.  Facial tone is symmetric.  Facial sensation is symmetric.  ROM of spine:  Limited ROM of lumbar spine with pain  Mild diffuse lower lumbar tenderness.   No abnormal lesions on exposed skin. Well healed lumbar incision.    Strength: Side Biceps Triceps Deltoid Interossei Grip Wrist Ext. Wrist Flex.  R 5 5 5 5 5 5 5   L 5 5 5 5 5 5 5    Side Iliopsoas Quads Hamstring PF DF EHL  R 5 5 5 5 5 5   L 5 5 5 5 5 5    Reflexes are 2+ and symmetric at the biceps, triceps, brachioradialis, patella and achilles.   Hoffman's is absent.  Clonus is not present.   Bilateral upper and lower extremity sensation is intact to light touch.    No evidence of dysmetria noted.  Gait is normal.    Medical Decision Making  Imaging: MRI lumbar spine 08/20/21:  FINDINGS: Segmentation:  5 lumbar type vertebral bodies.   Alignment:  Straightening of the normal lumbar lordosis.   Vertebrae:  No fracture or focal bone lesion.   Conus  medullaris and cauda equina: Conus extends to the L1-2 level. Conus and cauda equina appear normal.   Paraspinal and other soft tissues: Negative   Disc levels:   No abnormality from T11-12 through L1-2.   L2-3: Large broad-based left posterolateral predominant disc herniation with upward migration of disc material to the left of midline behind L2. Marked flattening of the thecal sac, particularly on the left. Likely compression of the exiting left L2 nerve.   L3-4: Chronic disc herniation more prominent towards the right. Stenosis of both lateral recesses and neural foramina, similar to the study of 2021.   L4-5: Previous posterior decompression, diskectomy and fusion. Good appearance with wide patency of the canal and foramina.   L5-S1: Shallow broad-based disc herniation contacts the ventral thecal sac and both S1 nerves. This could possibly be symptomatic. Similar appearance to the study of 2021.   IMPRESSION: New broad-based disc herniation at L2-3 more prominent in the left posterolateral direction. Extruded disc material migrated upward in the left lateral recess behind L2. Marked deformity of the thecal sac. Neural compression likely at this level, particularly on the left.  The exiting left L2 nerve is quite likely compressed.   L3-4: No change in the appearance of a broad-based, right posterolateral predominant disc herniation which could possibly be symptomatic.   L5-S1: No change in a shallow broad-based disc herniation contacting the thecal sac and both S1 nerves, which could possibly be symptomatic.  Electronically Signed   By: Paulina Fusi M.D.   On: 08/20/2021 16:32   CT scan of lumbar spine 08/20/21:  FINDINGS: Segmentation: 5 non rib-bearing lumbar type vertebral bodies.   Alignment: No substantial sagittal subluxation.  Straightening.   Vertebrae: L4-L5 PLIF. No evidence of hardware loosening or hardware fracture. Bony fusion across the L4-L5 disc space. No evidence of acute fracture.   Paraspinal and other soft tissues: No acute findings. Aorta bi-iliac calcific atherosclerosis.   Disc levels: Facet arthropathy at multiple levels, greatest lower lumbar spine. Endplate spurring and disc bulging at multiple levels with suspected right subarticular recess narrowing at L3-L4 with potentially moderate canal stenosis.   IMPRESSION: 1. No evidence of acute fracture or traumatic malalignment. 2. Solid L4-L5 PLIF. 3. Suspected right L3-L4 subarticular recess stenosis with potentially moderate canal stenosis. An MRI lumbar spine could further evaluate if clinically warranted.     Electronically Signed   By: Feliberto Harts M.D.   On: 08/20/2021 15:24    I have personally reviewed the images and agree with the above interpretation.  Assessment and Plan: Ms. Frith is a pleasant 57 y.o. female with 2-3 month history of constant LBP with constant bilateral leg pain (entire leg) to her feet. LBP = leg pain. Right leg pain = left leg pain.   History of lumbar fusion L4-L5 in 2020 and she did well postop.   Known left sided disc at L2-L3 that is extruded behind L2. Also with right sided disc L3-L4 and broad based disc at L5-S1. She  appears fused at L4-L5. Pain likely multifactorial, but may be from L5-S1 based on distribution.   Above treatment options discussed with patient and following plan made:   - Order for physical therapy for lumbar spine. She is homebound and lives with her friend Kristina Foster. HHPT orders done. - Continue on current medications including OTC tylenol as directed. Take with food.  - Continue on neurontin from other providers.  - Referral to Dr. Mariah Milling for possible lumbar injections.  - Follow up in  8 weeks for repeat evaluation. May consider EMG of no improvement.   I spent a total of 30 minutes in face-to-face and non-face-to-face activities related to this patient's care today.  Thank you for involving me in the care of this patient.   Drake Leach PA-C Dept. of Neurosurgery

## 2021-10-04 ENCOUNTER — Other Ambulatory Visit: Payer: Self-pay | Admitting: Neurosurgery

## 2021-10-08 ENCOUNTER — Telehealth: Payer: Self-pay

## 2021-10-08 NOTE — Telephone Encounter (Signed)
-----   Message from Peggyann Shoals sent at 10/08/2021 10:55 AM EDT ----- Regarding: verbal order Contact: [place] with [place] [employer] mail is secure you can leave message  Verbal order for PT  1 x 1  2 x 4 1 x 4  [place]  2 x 1 3 x 4 2 x 3   Social work evaluation

## 2021-10-08 NOTE — Telephone Encounter (Signed)
I spoke with Jeani Hawking and gave her a verbal order.

## 2021-10-10 NOTE — Progress Notes (Signed)
Patient's social worker is Roselind Rily and can be reached at [phone] or [phone].

## 2021-10-10 NOTE — Pre-Procedure Instructions (Signed)
Surgical Instructions    Your procedure is scheduled on Thursday 10/18/21.   Report to [facility] Main Entrance "A" at 10:45 A.M., then check in with the Admitting office.  Call this number if you have problems the morning of surgery:  [phone]   If you have any questions prior to your surgery date call [phone]: Open Monday-Friday 8am-4pm    Remember:  Do not eat or drink after midnight the night before your surgery   Take these medicines the morning of surgery with A SIP OF WATER:   estradiol (ESTRACE)   gabapentin (NEURONTIN)  LINZESS   metoprolol succinate (TOPROL-XL)   PARoxetine (PAXIL)  venlafaxine XR (EFFEXOR-XR)   Take these medicines if needed:   acetaminophen (TYLENOL)  As of today, STOP taking any Aspirin (unless otherwise instructed by your surgeon) Aleve, Naproxen, Ibuprofen, Motrin, Advil, Goody's, BC's, all herbal medications, fish oil, meloxicam (MOBIC), and all vitamins.  WHAT DO I DO ABOUT MY DIABETES MEDICATION?   Do not take oral diabetes medicines (pills) the morning of surgery.  DO NOT TAKE FARXIGA three days prior to surgery. Last dose on 10/14/21. DO NOT TAKE FARXIGA on 9/25, 9/26, 9/27.  DO NOT TAKE NOVOLOG MIX 70/30 the morning of surgery.   DO NOT TAKE TRULICITY 7 days prior to surgery. STOP TAKING TRULICITY ON 10/11/21.   TRESIBA- Confirm time of day with patient then give instructions accordingly.   The day of surgery, do not take other diabetes injectables, including Byetta (exenatide), Bydureon (exenatide ER), Victoza (liraglutide), or Trulicity (dulaglutide).  If your CBG is greater than 220 mg/dL, you may take  of your sliding scale (correction) dose of insulin.   HOW TO MANAGE YOUR DIABETES BEFORE AND AFTER SURGERY  Why is it important to control my blood sugar before and after surgery? Improving blood sugar levels before and after surgery helps healing and can limit problems. A way of improving blood sugar control is eating  a healthy diet by:  Eating less sugar and carbohydrates  Increasing activity/exercise  Talking with your doctor about reaching your blood sugar goals High blood sugars (greater than 180 mg/dL) can raise your risk of infections and slow your recovery, so you will need to focus on controlling your diabetes during the weeks before surgery. Make sure that the doctor who takes care of your diabetes knows about your planned surgery including the date and location.  How do I manage my blood sugar before surgery? Check your blood sugar at least 4 times a day, starting 2 days before surgery, to make sure that the level is not too high or low.  Check your blood sugar the morning of your surgery when you wake up and every 2 hours until you get to the Short Stay unit.  If your blood sugar is less than 70 mg/dL, you will need to treat for low blood sugar: Do not take insulin. Treat a low blood sugar (less than 70 mg/dL) with  cup of clear juice (cranberry or apple), 4 glucose tablets, OR glucose gel. Recheck blood sugar in 15 minutes after treatment (to make sure it is greater than 70 mg/dL). If your blood sugar is not greater than 70 mg/dL on recheck, call [phone] for further instructions. Report your blood sugar to the short stay nurse when you get to Short Stay.  If you are admitted to the hospital after surgery: Your blood sugar will be checked by the staff and you will probably be given insulin after surgery (instead  of oral diabetes medicines) to make sure you have good blood sugar levels. The goal for blood sugar control after surgery is 80-180 mg/dL.              Do not wear jewelry or makeup. Do not wear lotions, powders, perfumes/cologne or deodorant. Do not shave 48 hours prior to surgery.  Men may shave face and neck. Do not bring valuables to the hospital. Do not wear nail polish, gel polish, artificial nails, or any other type of covering on natural nails (fingers and toes) If  you have artificial nails or gel coating that need to be removed by a nail salon, please have this removed prior to surgery. Artificial nails or gel coating may interfere with anesthesia's ability to adequately monitor your vital signs.  [facility] is not responsible for any belongings or valuables.    Do NOT Smoke (Tobacco/Vaping)  24 hours prior to your procedure  If you use a CPAP at night, you may bring your mask for your overnight stay.   Contacts, glasses, hearing aids, dentures or partials may not be worn into surgery, please bring cases for these belongings   For patients admitted to the hospital, discharge time will be determined by your treatment team.   Patients discharged the day of surgery will not be allowed to drive home, and someone needs to stay with them for 24 hours.   SURGICAL WAITING ROOM VISITATION Patients having surgery or a procedure may have no more than 2 support people in the waiting area - these visitors may rotate.   Children under the age of 57 must have an adult with them who is not the patient. If the patient needs to stay at the hospital during part of their recovery, the visitor guidelines for inpatient rooms apply. Pre-op nurse will coordinate an appropriate time for 1 support person to accompany patient in pre-op.  This support person may not rotate.   Please refer to the [facility] website for the visitor guidelines for Inpatients (after your surgery is over and you are in a regular room).    Special instructions:    Oral Hygiene is also important to reduce your risk of infection.  Remember - BRUSH YOUR TEETH THE MORNING OF SURGERY WITH YOUR REGULAR TOOTHPASTE   [place]- Preparing For Surgery  Before surgery, you can play an important role. Because skin is not sterile, your skin needs to be as free of germs as possible. You can reduce the number of germs on your skin by washing with CHG (chlorahexidine gluconate) Soap before surgery.  CHG is  an antiseptic cleaner which kills germs and bonds with the skin to continue killing germs even after washing.     Please do not use if you have an allergy to CHG or antibacterial soaps. If your skin becomes reddened/irritated stop using the CHG.  Do not shave (including legs and underarms) for at least 48 hours prior to first CHG shower. It is OK to shave your face.  Please follow these instructions carefully.     Shower the NIGHT BEFORE SURGERY and the MORNING OF SURGERY with CHG Soap.   If you chose to wash your hair, wash your hair first as usual with your normal shampoo. After you shampoo, rinse your hair and body thoroughly to remove the shampoo.  Then [employer] and genitals (private parts) with your normal soap and rinse thoroughly to remove soap.  After that Use CHG Soap as you would any other  liquid soap. You can apply CHG directly to the skin and wash gently with a scrungie or a clean washcloth.   Apply the CHG Soap to your body ONLY FROM THE NECK DOWN.  Do not use on open wounds or open sores. Avoid contact with your eyes, ears, mouth and genitals (private parts). Wash Face and genitals (private parts)  with your normal soap.   Wash thoroughly, paying special attention to the area where your surgery will be performed.  Thoroughly rinse your body with warm water from the neck down.  DO NOT shower/wash with your normal soap after using and rinsing off the CHG Soap.  Pat yourself dry with a CLEAN TOWEL.  Wear CLEAN PAJAMAS to bed the night before surgery  Place CLEAN SHEETS on your bed the night before your surgery  DO NOT SLEEP WITH PETS.   Day of Surgery:  Take a shower with CHG soap. Wear Clean/Comfortable clothing the morning of surgery Do not apply any deodorants/lotions.   Remember to brush your teeth WITH YOUR REGULAR TOOTHPASTE.    If you received a COVID test during your pre-op visit, it is requested that you wear a mask when out in public, stay away from  anyone that may not be feeling well, and notify your surgeon if you develop symptoms. If you have been in contact with anyone that has tested positive in the last 10 days, please notify your surgeon.    Please read over the following fact sheets that you were given.

## 2021-10-11 ENCOUNTER — Other Ambulatory Visit: Payer: Self-pay

## 2021-10-11 ENCOUNTER — Encounter (HOSPITAL_COMMUNITY): Payer: Self-pay

## 2021-10-11 ENCOUNTER — Encounter (HOSPITAL_COMMUNITY)
Admission: RE | Admit: 2021-10-11 | Discharge: 2021-10-11 | Disposition: A | Discharge status: Home / Self Care | Payer: Medicare Other | Source: Ambulatory Visit | Attending: Neurosurgery | Admitting: Neurosurgery

## 2021-10-11 VITALS — BP 144/96 | HR 75 | Temp 98.0°F | Resp 17 | Ht 62.0 in | Wt 185.5 lb

## 2021-10-11 DIAGNOSIS — M5126 Other intervertebral disc displacement, lumbar region: Secondary | ICD-10-CM | POA: Diagnosis not present

## 2021-10-11 DIAGNOSIS — I10 Essential (primary) hypertension: Secondary | ICD-10-CM | POA: Insufficient documentation

## 2021-10-11 DIAGNOSIS — Z01818 Encounter for other preprocedural examination: Secondary | ICD-10-CM | POA: Diagnosis present

## 2021-10-11 DIAGNOSIS — Z794 Long term (current) use of insulin: Secondary | ICD-10-CM | POA: Diagnosis not present

## 2021-10-11 DIAGNOSIS — E785 Hyperlipidemia, unspecified: Secondary | ICD-10-CM | POA: Insufficient documentation

## 2021-10-11 DIAGNOSIS — E119 Type 2 diabetes mellitus without complications: Secondary | ICD-10-CM | POA: Diagnosis not present

## 2021-10-11 DIAGNOSIS — I4510 Unspecified right bundle-branch block: Secondary | ICD-10-CM | POA: Insufficient documentation

## 2021-10-11 HISTORY — DX: Unspecified right bundle-branch block: I45.10

## 2021-10-11 LAB — CBC
HCT: 35.8 % — ABNORMAL LOW (ref 36.0–46.0)
Hemoglobin: 12.2 g/dL (ref 12.0–15.0)
MCH: 31.5 pg (ref 26.0–34.0)
MCHC: 34.1 g/dL (ref 30.0–36.0)
MCV: 92.5 fL (ref 80.0–100.0)
Platelets: 151 10*3/uL (ref 150–400)
RBC: 3.87 MIL/uL (ref 3.87–5.11)
RDW: 12.5 % (ref 11.5–15.5)
WBC: 6.3 10*3/uL (ref 4.0–10.5)
nRBC: 0 % (ref 0.0–0.2)

## 2021-10-11 LAB — BASIC METABOLIC PANEL
Anion gap: 6 (ref 5–15)
BUN: 19 mg/dL (ref 6–20)
CO2: 26 mmol/L (ref 22–32)
Calcium: 9.5 mg/dL (ref 8.9–10.3)
Chloride: 107 mmol/L (ref 98–111)
Creatinine, Ser: 1.38 mg/dL — ABNORMAL HIGH (ref 0.44–1.00)
GFR, Estimated: 45 mL/min — ABNORMAL LOW (ref >60)
Glucose, Bld: 100 mg/dL — ABNORMAL HIGH (ref 70–99)
Potassium: 3.7 mmol/L (ref 3.5–5.1)
Sodium: 139 mmol/L (ref 135–145)

## 2021-10-11 LAB — GLUCOSE, CAPILLARY: Glucose-Capillary: 99 mg/dL (ref 70–99)

## 2021-10-11 LAB — SURGICAL PCR SCREEN
MRSA, PCR: NEGATIVE
Staphylococcus aureus: POSITIVE — AB

## 2021-10-11 LAB — HEMOGLOBIN A1C
Hgb A1c MFr Bld: 6.2 % — ABNORMAL HIGH (ref 4.8–5.6)
Mean Plasma Glucose: 131.24 mg/dL

## 2021-10-11 NOTE — Progress Notes (Signed)
PCP - Threasa Alpha FNP Cardiologist - Denies  PPM/ICD - Denies  Chest x-ray - N/A EKG - 10/11/21 Stress Test - Denies ECHO - Denies Cardiac Cath - Denies  Sleep Study - Denies  Fasting Blood Sugar - low 100's Checks Blood Sugar __1___ time a day  Blood Thinner Instructions: N/A Aspirin Instructions: Per caregiver, pt was instructed to stop ASA 5-7 days before surgery. She stated that she will have pt take the last dose tomorrow 10/12/21.  ERAS Protcol - No  COVID TEST- N/A  I called pt's Education officer, museum for telephone consent. The social worker stated that her director is the person that has to give verbal consent for the surgery. I gave her a call back number and asked to tell the director to call me asap for verbal consent.  Anesthesia review: Yes, abnormal EKG  Patient denies shortness of breath, fever, cough and chest pain at PAT appointment   All instructions explained to the patient, with a verbal understanding of the material. Patient agrees to go over the instructions while at home for a better understanding. Patient also instructed to self quarantine after being tested for COVID-19. The opportunity to ask questions was provided.

## 2021-10-11 NOTE — Pre-Procedure Instructions (Signed)
Surgical Instructions    Your procedure is scheduled on Thursday 10/18/21.   Report to [facility] Main Entrance "A" at 10:45 A.M., then check in with the Admitting office.  Call this number if you have problems the morning of surgery:  [phone]   If you have any questions prior to your surgery date call [phone]: Open Monday-Friday 8am-4pm    Remember:  Do not eat or drink after midnight the night before your surgery   Take these medicines the morning of surgery with A SIP OF WATER:   estradiol (ESTRACE)   gabapentin (NEURONTIN)  LINZESS   metoprolol succinate (TOPROL-XL)   PARoxetine (PAXIL)  venlafaxine XR (EFFEXOR-XR)   Take these medicines if needed:   acetaminophen (TYLENOL)  As of today, STOP taking any Aspirin (unless otherwise instructed by your surgeon) Aleve, Naproxen, Ibuprofen, Motrin, Advil, Goody's, BC's, all herbal medications, fish oil, meloxicam (MOBIC), and all vitamins.  WHAT DO I DO ABOUT MY DIABETES MEDICATION?   Do not take oral diabetes medicines (pills) the morning of surgery.  DO NOT TAKE FARXIGA three days prior to surgery. Last dose on 10/14/21. DO NOT TAKE FARXIGA on 9/25, 9/26, 9/27.  DO NOT TAKE NOVOLOG MIX 70/30 the morning of surgery.   DO NOT TAKE TRULICITY 7 days prior to surgery. STOP TAKING TRULICITY ON [id number].   Only take HALF of TRESIBA the morning of surgery (17 units).   The day of surgery, do not take other diabetes injectables, including Byetta (exenatide), Bydureon (exenatide ER), Victoza (liraglutide), or Trulicity (dulaglutide).  If your CBG is greater than 220 mg/dL, you may take  of your sliding scale (correction) dose of insulin.   HOW TO MANAGE YOUR DIABETES BEFORE AND AFTER SURGERY  Why is it important to control my blood sugar before and after surgery? Improving blood sugar levels before and after surgery helps healing and can limit problems. A way of improving blood sugar control is eating a healthy diet  by:  Eating less sugar and carbohydrates  Increasing activity/exercise  Talking with your doctor about reaching your blood sugar goals High blood sugars (greater than 180 mg/dL) can raise your risk of infections and slow your recovery, so you will need to focus on controlling your diabetes during the weeks before surgery. Make sure that the doctor who takes care of your diabetes knows about your planned surgery including the date and location.  How do I manage my blood sugar before surgery? Check your blood sugar at least 4 times a day, starting 2 days before surgery, to make sure that the level is not too high or low.  Check your blood sugar the morning of your surgery when you wake up and every 2 hours until you get to the Short Stay unit.  If your blood sugar is less than 70 mg/dL, you will need to treat for low blood sugar: Do not take insulin. Treat a low blood sugar (less than 70 mg/dL) with  cup of clear juice (cranberry or apple), 4 glucose tablets, OR glucose gel. Recheck blood sugar in 15 minutes after treatment (to make sure it is greater than 70 mg/dL). If your blood sugar is not greater than 70 mg/dL on recheck, call [phone] for further instructions. Report your blood sugar to the short stay nurse when you get to Short Stay.  If you are admitted to the hospital after surgery: Your blood sugar will be checked by the staff and you will probably be given insulin after surgery (instead  of oral diabetes medicines) to make sure you have good blood sugar levels. The goal for blood sugar control after surgery is 80-180 mg/dL.              Do not wear jewelry or makeup. Do not wear lotions, powders, perfumes/cologne or deodorant. Do not shave 48 hours prior to surgery.  Men may shave face and neck. Do not bring valuables to the hospital. Do not wear nail polish, gel polish, artificial nails, or any other type of covering on natural nails (fingers and toes) If you have  artificial nails or gel coating that need to be removed by a nail salon, please have this removed prior to surgery. Artificial nails or gel coating may interfere with anesthesia's ability to adequately monitor your vital signs.  [facility] is not responsible for any belongings or valuables.    Do NOT Smoke (Tobacco/Vaping)  24 hours prior to your procedure  If you use a CPAP at night, you may bring your mask for your overnight stay.   Contacts, glasses, hearing aids, dentures or partials may not be worn into surgery, please bring cases for these belongings   For patients admitted to the hospital, discharge time will be determined by your treatment team.   Patients discharged the day of surgery will not be allowed to drive home, and someone needs to stay with them for 24 hours.   SURGICAL WAITING ROOM VISITATION Patients having surgery or a procedure may have no more than 2 support people in the waiting area - these visitors may rotate.   Children under the age of 27 must have an adult with them who is not the patient. If the patient needs to stay at the hospital during part of their recovery, the visitor guidelines for inpatient rooms apply. Pre-op nurse will coordinate an appropriate time for 1 support person to accompany patient in pre-op.  This support person may not rotate.   Please refer to the [facility] website for the visitor guidelines for Inpatients (after your surgery is over and you are in a regular room).    Special instructions:    Oral Hygiene is also important to reduce your risk of infection.  Remember - BRUSH YOUR TEETH THE MORNING OF SURGERY WITH YOUR REGULAR TOOTHPASTE   [place]- Preparing For Surgery  Before surgery, you can play an important role. Because skin is not sterile, your skin needs to be as free of germs as possible. You can reduce the number of germs on your skin by washing with CHG (chlorahexidine gluconate) Soap before surgery.  CHG is an  antiseptic cleaner which kills germs and bonds with the skin to continue killing germs even after washing.     Please do not use if you have an allergy to CHG or antibacterial soaps. If your skin becomes reddened/irritated stop using the CHG.  Do not shave (including legs and underarms) for at least 48 hours prior to first CHG shower. It is OK to shave your face.  Please follow these instructions carefully.     Shower the NIGHT BEFORE SURGERY and the MORNING OF SURGERY with CHG Soap.   If you chose to wash your hair, wash your hair first as usual with your normal shampoo. After you shampoo, rinse your hair and body thoroughly to remove the shampoo.  Then [employer] and genitals (private parts) with your normal soap and rinse thoroughly to remove soap.  After that Use CHG Soap as you would any other  liquid soap. You can apply CHG directly to the skin and wash gently with a scrungie or a clean washcloth.   Apply the CHG Soap to your body ONLY FROM THE NECK DOWN.  Do not use on open wounds or open sores. Avoid contact with your eyes, ears, mouth and genitals (private parts). Wash Face and genitals (private parts)  with your normal soap.   Wash thoroughly, paying special attention to the area where your surgery will be performed.  Thoroughly rinse your body with warm water from the neck down.  DO NOT shower/wash with your normal soap after using and rinsing off the CHG Soap.  Pat yourself dry with a CLEAN TOWEL.  Wear CLEAN PAJAMAS to bed the night before surgery  Place CLEAN SHEETS on your bed the night before your surgery  DO NOT SLEEP WITH PETS.   Day of Surgery:  Take a shower with CHG soap. Wear Clean/Comfortable clothing the morning of surgery Do not apply any deodorants/lotions.   Remember to brush your teeth WITH YOUR REGULAR TOOTHPASTE.    If you received a COVID test during your pre-op visit, it is requested that you wear a mask when out in public, stay away from anyone  that may not be feeling well, and notify your surgeon if you develop symptoms. If you have been in contact with anyone that has tested positive in the last 10 days, please notify your surgeon.    Please read over the following fact sheets that you were given.

## 2021-10-12 ENCOUNTER — Encounter (HOSPITAL_COMMUNITY): Payer: Self-pay

## 2021-10-12 NOTE — Anesthesia Preprocedure Evaluation (Signed)
Anesthesia Evaluation  Patient identified by MRN, date of birth, ID band Patient awake    Reviewed: Allergy & Precautions, NPO status , Patient's Chart, lab work & pertinent test results  Airway Mallampati: II       Dental   Pulmonary neg pulmonary ROS,    breath sounds clear to auscultation       Cardiovascular hypertension, + dysrhythmias  Rhythm:Regular Rate:Normal     Neuro/Psych PSYCHIATRIC DISORDERS Hx noted Dr. Nyoka Cowden    GI/Hepatic negative GI ROS, Neg liver ROS,   Endo/Other  diabetes  Renal/GU negative Renal ROS     Musculoskeletal  (+) Arthritis ,   Abdominal   Peds  Hematology   Anesthesia Other Findings   Reproductive/Obstetrics                            Anesthesia Physical Anesthesia Plan  ASA: 3  Anesthesia Plan: General   Post-op Pain Management: Tylenol PO (pre-op)*   Induction: Intravenous  PONV Risk Score and Plan: 3 and Ondansetron, Dexamethasone and Midazolam  Airway Management Planned: Oral ETT  Additional Equipment:   Intra-op Plan:   Post-operative Plan: Extubation in OR  Informed Consent: I have reviewed the patients History and Physical, chart, labs and discussed the procedure including the risks, benefits and alternatives for the proposed anesthesia with the patient or authorized representative who has indicated his/her understanding and acceptance.     Dental advisory given  Plan Discussed with: CRNA and Anesthesiologist  Anesthesia Plan Comments: (PAT note written 10/12/2021 by Myra Gianotti, PA-C. )       Anesthesia Quick Evaluation

## 2021-10-12 NOTE — Progress Notes (Signed)
Anesthesia Chart Review:  Case: [id number] Date/Time: 10/18/21 1230   Procedure: MICRODISCECTOMY L2-3 (Left) - 3C   Anesthesia type: General   Pre-op diagnosis: HERNIATED NUCLEUS PULPOSUS, LUMBAR   Location: [place] OR ROOM 28 / [facility]   Surgeons: Consuella Lose, MD       DISCUSSION: Patient is a 56 year old female scheduled for the above procedure.  History includes never smoker, HTN, HLD, DM2, right BBB (since at least 06/21/10), hysterectomy, spinal surgery (L4-5 PLIF 08/02/14), mental disability.  Patient lives in a group home and has a caregiver. Patient's social worker is Roselind Rily and can be reached at [phone] or [phone].  She was seen in the ED on 08/20/2021 for back pain with 2 mechanical falls in the past month. CT scan demonstrates a suspected right L3-L4 subarticular recess stenosis with potentially moderate canal stenosis, so MRI obtained that revealed "a new broad-based disc herniation at L2-L3 with extruded disc material migrating upwards in the left lateral recess behind L2 with marked deformity of the thecal sac and likely neural compression at this level particularly on the left which is the side that the patient has more pain on." No focal neurological deficits at that time with intact bowel and bladder function, so on-call neurosurgeon recommended steroids with outpatient neurosurgery follow-up.    Reported instructions to hold aspirin 5 to 7 days prior to surgery, last aspirin planned for 10/12/2021.  PAT instructions included holding Farxiga for 3 days prior to surgery and Trulicity 7 days prior to surgery.  Anesthesia team to evaluate on the day of surgery.    VS: BP (!) 144/96   Pulse 75   Temp 36.7 C   Resp 17   Ht 5\' 2"  (1.575 m)   Wt 84.1 kg   SpO2 100%   BMI 33.93 kg/m    PROVIDERS: Remi Haggard, FNP is PCP   LABS: Labs reviewed: Acceptable for surgery. Cr 1.38, previously 1.26 on 08/20/21 and 1.54 on 04/20/19.  (all labs ordered are listed,  but only abnormal results are displayed)  Labs Reviewed  SURGICAL PCR SCREEN - Abnormal; Notable for the following components:      Result Value   Staphylococcus aureus POSITIVE (*)    All other components within normal limits  HEMOGLOBIN A1C - Abnormal; Notable for the following components:   Hgb A1c MFr Bld 6.2 (*)    All other components within normal limits  BASIC METABOLIC PANEL - Abnormal; Notable for the following components:   Glucose, Bld 100 (*)    Creatinine, Ser 1.38 (*)    GFR, Estimated 45 (*)    All other components within normal limits  CBC - Abnormal; Notable for the following components:   HCT 35.8 (*)    All other components within normal limits  GLUCOSE, CAPILLARY     IMAGES: MRI L-spine 731/23: IMPRESSION: - New broad-based disc herniation at L2-3 more prominent in the left posterolateral direction. Extruded disc material migrated upward in the left lateral recess behind L2. Marked deformity of the thecal sac. Neural compression likely at this level, particularly on the left. The exiting left L2 nerve is quite likely compressed. - L3-4: No change in the appearance of a broad-based, right posterolateral predominant disc herniation which could possibly be symptomatic. - L5-S1: No change in a shallow broad-based disc herniation contacting the thecal sac and both S1 nerves, which could possibly be symptomatic.   CT L-spine 08/20/21: IMPRESSION: 1. No evidence of acute fracture or traumatic malalignment. 2.  Solid L4-L5 PLIF. 3. Suspected right L3-L4 subarticular recess stenosis with potentially moderate canal stenosis. An MRI lumbar spine could further evaluate if clinically warranted.     EKG: 10/11/21: Normal sinus rhythm Right bundle branch block Abnormal ECG When compared with ECG of 07-Dec-2015 23:25, No significant change since Confirmed by Adrian Prows ([id number]) on 10/11/2021 10:20:37 PM - RBBB has been present since at least 06/21/10   CV: Echo  12/08/15: Study Conclusions  - Left ventricle: The cavity size was normal. Systolic function was    normal. The estimated ejection fraction was in the range of 55%    to 60%.   Past Medical History:  Diagnosis Date   Anxiety    Arthritis    Constipation    Depression    Diabetes mellitus without complication ([place])    Type 2    Dizziness    Hyperlipemia    Hypertension    Mental disability    Schizo affective schizophrenia [facility])     Past Surgical History:  Procedure Laterality Date   ABDOMINAL HYSTERECTOMY     BACK SURGERY     COLONOSCOPY WITH PROPOFOL N/A 11/11/2018   Procedure: COLONOSCOPY WITH PROPOFOL;  Surgeon: Robert Bellow, MD;  Location: [facility] ENDOSCOPY;  Service: Endoscopy;  Laterality: N/A;   ESOPHAGOGASTRODUODENOSCOPY (EGD) WITH PROPOFOL N/A 11/11/2018   Procedure: ESOPHAGOGASTRODUODENOSCOPY (EGD) WITH PROPOFOL;  Surgeon: Robert Bellow, MD;  Location: [facility] ENDOSCOPY;  Service: Endoscopy;  Laterality: N/A;    MEDICATIONS:  acetaminophen (TYLENOL) 650 MG CR tablet   aspirin EC 81 MG tablet   atorvastatin (LIPITOR) 40 MG tablet   benztropine (COGENTIN) 1 MG tablet   Cholecalciferol 125 MCG (5000 UT) TABS   estradiol (ESTRACE) 0.5 MG tablet   FARXIGA 10 MG TABS tablet   furosemide (LASIX) 40 MG tablet   gabapentin (NEURONTIN) 300 MG capsule   Glucagon 1 MG/0.2ML SOAJ   LINZESS 145 MCG CAPS capsule   meloxicam (MOBIC) 15 MG tablet   metoprolol succinate (TOPROL-XL) 100 MG 24 hr tablet   NOVOLOG MIX 70/30 FLEXPEN (70-30) 100 UNIT/ML FlexPen   PARoxetine (PAXIL) 10 MG tablet   polyethylene glycol (MIRALAX / GLYCOLAX) 17 g packet   potassium chloride SA (K-DUR,KLOR-CON) 20 MEQ tablet   risperiDONE (RISPERDAL) 2 MG tablet   TRESIBA FLEXTOUCH 200 UNIT/ML FlexTouch Pen   TRULICITY [id number] [id number] SOPN   venlafaxine XR (EFFEXOR-XR) 150 MG 24 hr capsule   venlafaxine XR (EFFEXOR-XR) 75 MG 24 hr capsule   No current facility-administered medications for this  encounter.    Myra Gianotti, PA-C Surgical Short Stay/Anesthesiology [facility] Phone [phone] [facility] Phone [phone] 10/12/2021 5:19 PM

## 2021-10-18 ENCOUNTER — Encounter (HOSPITAL_COMMUNITY): Payer: Self-pay | Admitting: Neurosurgery

## 2021-10-18 ENCOUNTER — Other Ambulatory Visit: Payer: Self-pay

## 2021-10-18 ENCOUNTER — Inpatient Hospital Stay (HOSPITAL_COMMUNITY)
Admission: RE | Admit: 2021-10-18 | Discharge: 2021-10-22 | DRG: 519 | Disposition: A | Discharge status: Skilled Nursing Facility | Payer: Medicare Other | Attending: Neurosurgery | Admitting: Neurosurgery

## 2021-10-18 ENCOUNTER — Ambulatory Visit (HOSPITAL_COMMUNITY): Payer: Medicare Other

## 2021-10-18 ENCOUNTER — Ambulatory Visit (HOSPITAL_COMMUNITY): Payer: Medicare Other | Admitting: Physician Assistant

## 2021-10-18 ENCOUNTER — Encounter (HOSPITAL_COMMUNITY)
Admission: RE | Disposition: A | Discharge status: Skilled Nursing Facility | Payer: Self-pay | Source: Home / Self Care | Attending: Neurosurgery

## 2021-10-18 DIAGNOSIS — M1990 Unspecified osteoarthritis, unspecified site: Secondary | ICD-10-CM

## 2021-10-18 DIAGNOSIS — R262 Difficulty in walking, not elsewhere classified: Secondary | ICD-10-CM | POA: Diagnosis present

## 2021-10-18 DIAGNOSIS — M6281 Muscle weakness (generalized): Secondary | ICD-10-CM | POA: Diagnosis present

## 2021-10-18 DIAGNOSIS — E785 Hyperlipidemia, unspecified: Secondary | ICD-10-CM | POA: Diagnosis present

## 2021-10-18 DIAGNOSIS — I10 Essential (primary) hypertension: Secondary | ICD-10-CM | POA: Diagnosis not present

## 2021-10-18 DIAGNOSIS — Z79890 Hormone replacement therapy (postmenopausal): Secondary | ICD-10-CM

## 2021-10-18 DIAGNOSIS — Z794 Long term (current) use of insulin: Secondary | ICD-10-CM

## 2021-10-18 DIAGNOSIS — Z981 Arthrodesis status: Secondary | ICD-10-CM

## 2021-10-18 DIAGNOSIS — Z9181 History of falling: Secondary | ICD-10-CM | POA: Diagnosis not present

## 2021-10-18 DIAGNOSIS — M5126 Other intervertebral disc displacement, lumbar region: Secondary | ICD-10-CM | POA: Diagnosis present

## 2021-10-18 DIAGNOSIS — G9741 Accidental puncture or laceration of dura during a procedure: Secondary | ICD-10-CM | POA: Diagnosis not present

## 2021-10-18 DIAGNOSIS — Z90710 Acquired absence of both cervix and uterus: Secondary | ICD-10-CM

## 2021-10-18 DIAGNOSIS — Z79899 Other long term (current) drug therapy: Secondary | ICD-10-CM

## 2021-10-18 DIAGNOSIS — Z7984 Long term (current) use of oral hypoglycemic drugs: Secondary | ICD-10-CM

## 2021-10-18 DIAGNOSIS — Z7985 Long-term (current) use of injectable non-insulin antidiabetic drugs: Secondary | ICD-10-CM | POA: Diagnosis not present

## 2021-10-18 DIAGNOSIS — Z608 Other problems related to social environment: Secondary | ICD-10-CM | POA: Diagnosis present

## 2021-10-18 DIAGNOSIS — M79604 Pain in right leg: Secondary | ICD-10-CM | POA: Diagnosis present

## 2021-10-18 DIAGNOSIS — E119 Type 2 diabetes mellitus without complications: Secondary | ICD-10-CM

## 2021-10-18 DIAGNOSIS — Z7982 Long term (current) use of aspirin: Secondary | ICD-10-CM | POA: Diagnosis not present

## 2021-10-18 DIAGNOSIS — Z791 Long term (current) use of non-steroidal anti-inflammatories (NSAID): Secondary | ICD-10-CM | POA: Diagnosis not present

## 2021-10-18 DIAGNOSIS — G9609 Other spinal cerebrospinal fluid leak: Secondary | ICD-10-CM | POA: Diagnosis not present

## 2021-10-18 DIAGNOSIS — F79 Unspecified intellectual disabilities: Secondary | ICD-10-CM | POA: Diagnosis present

## 2021-10-18 DIAGNOSIS — F259 Schizoaffective disorder, unspecified: Secondary | ICD-10-CM | POA: Diagnosis present

## 2021-10-18 HISTORY — PX: LUMBAR LAMINECTOMY/DECOMPRESSION MICRODISCECTOMY: SHX5026

## 2021-10-18 LAB — GLUCOSE, CAPILLARY
Glucose-Capillary: 137 mg/dL — ABNORMAL HIGH (ref 70–99)
Glucose-Capillary: 118 mg/dL — ABNORMAL HIGH (ref 70–99)
Glucose-Capillary: 123 mg/dL — ABNORMAL HIGH (ref 70–99)
Glucose-Capillary: 218 mg/dL — ABNORMAL HIGH (ref 70–99)

## 2021-10-18 SURGERY — LUMBAR LAMINECTOMY/DECOMPRESSION MICRODISCECTOMY 1 LEVEL
Anesthesia: General | Site: Spine Lumbar | Laterality: Left

## 2021-10-18 MED ORDER — SODIUM CHLORIDE 0.9% FLUSH
3.0000 mL | Freq: Two times a day (BID) | INTRAVENOUS | Status: DC
  Administered 2021-10-18 – 2021-10-22 (×7): 3 mL via INTRAVENOUS

## 2021-10-18 MED ORDER — EPHEDRINE SULFATE-NACL 50-0.9 MG/10ML-% IV SOSY
PREFILLED_SYRINGE | INTRAVENOUS | Status: DC | PRN
  Administered 2021-10-18 (×4): 5 mg via INTRAVENOUS

## 2021-10-18 MED ORDER — DULAGLUTIDE 0.75 MG/0.5ML ~~LOC~~ SOAJ: 0.7500 mg | SUBCUTANEOUS | Status: DC

## 2021-10-18 MED ORDER — ESTRADIOL 1 MG PO TABS
0.5000 mg | ORAL_TABLET | Freq: Every day | ORAL | Status: DC
  Administered 2021-10-19 – 2021-10-22 (×4): 0.5 mg via ORAL
  Filled 2021-10-18 (×4): qty 0.5

## 2021-10-18 MED ORDER — INSULIN ASPART 100 UNIT/ML IJ SOLN
0.0000 [IU] | Freq: Three times a day (TID) | INTRAMUSCULAR | Status: DC
  Administered 2021-10-19: 5 [IU] via SUBCUTANEOUS
  Administered 2021-10-19: 3 [IU] via SUBCUTANEOUS
  Administered 2021-10-19: 5 [IU] via SUBCUTANEOUS
  Administered 2021-10-20 (×2): 2 [IU] via SUBCUTANEOUS
  Administered 2021-10-20: 5 [IU] via SUBCUTANEOUS
  Administered 2021-10-21: 2 [IU] via SUBCUTANEOUS
  Administered 2021-10-21 – 2021-10-22 (×4): 3 [IU] via SUBCUTANEOUS

## 2021-10-18 MED ORDER — FENTANYL CITRATE (PF) 250 MCG/5ML IJ SOLN
INTRAMUSCULAR | Status: DC | PRN
  Administered 2021-10-18 (×3): 50 ug via INTRAVENOUS

## 2021-10-18 MED ORDER — PAROXETINE HCL 10 MG PO TABS
10.0000 mg | ORAL_TABLET | Freq: Every day | ORAL | Status: DC
  Administered 2021-10-19 – 2021-10-22 (×4): 10 mg via ORAL
  Filled 2021-10-18 (×4): qty 1

## 2021-10-18 MED ORDER — CHLORHEXIDINE GLUCONATE 0.12 % MT SOLN
15.0000 mL | Freq: Once | OROMUCOSAL | Status: AC
  Administered 2021-10-18: 15 mL via OROMUCOSAL
  Filled 2021-10-18: qty 15

## 2021-10-18 MED ORDER — CEFAZOLIN SODIUM-DEXTROSE 2-4 GM/100ML-% IV SOLN
2.0000 g | INTRAVENOUS | Status: AC
  Administered 2021-10-18: 2 g via INTRAVENOUS
  Filled 2021-10-18: qty 100

## 2021-10-18 MED ORDER — PROPOFOL 10 MG/ML IV BOLUS
INTRAVENOUS | Status: DC | PRN
  Administered 2021-10-18: 25 mg via INTRAVENOUS
  Administered 2021-10-18: 150 mg via INTRAVENOUS

## 2021-10-18 MED ORDER — DOCUSATE SODIUM 100 MG PO CAPS
100.0000 mg | ORAL_CAPSULE | Freq: Two times a day (BID) | ORAL | Status: DC
  Administered 2021-10-18 – 2021-10-22 (×8): 100 mg via ORAL
  Filled 2021-10-18 (×8): qty 1

## 2021-10-18 MED ORDER — HYDROMORPHONE HCL 1 MG/ML IJ SOLN
0.2500 mg | INTRAMUSCULAR | Status: DC | PRN
  Administered 2021-10-18: 0.5 mg via INTRAVENOUS

## 2021-10-18 MED ORDER — INSULIN GLARGINE-YFGN 100 UNIT/ML ~~LOC~~ SOLN
35.0000 [IU] | Freq: Every day | SUBCUTANEOUS | Status: DC
  Administered 2021-10-19 – 2021-10-22 (×4): 35 [IU] via SUBCUTANEOUS
  Filled 2021-10-18 (×4): qty 0.35

## 2021-10-18 MED ORDER — SODIUM CHLORIDE 0.9 % IV SOLN: 250.0000 mL | INTRAVENOUS | Status: DC

## 2021-10-18 MED ORDER — PHENYLEPHRINE 80 MCG/ML (10ML) SYRINGE FOR IV PUSH (FOR BLOOD PRESSURE SUPPORT)
PREFILLED_SYRINGE | INTRAVENOUS | Status: DC | PRN
  Administered 2021-10-18: 80 ug via INTRAVENOUS
  Administered 2021-10-18: 160 ug via INTRAVENOUS

## 2021-10-18 MED ORDER — METHOCARBAMOL 1000 MG/10ML IJ SOLN: 500.0000 mg | Freq: Four times a day (QID) | INTRAVENOUS | Status: DC | PRN

## 2021-10-18 MED ORDER — ROCURONIUM BROMIDE 10 MG/ML (PF) SYRINGE
PREFILLED_SYRINGE | INTRAVENOUS | Status: DC | PRN
  Administered 2021-10-18: 20 mg via INTRAVENOUS
  Administered 2021-10-18: 50 mg via INTRAVENOUS

## 2021-10-18 MED ORDER — POTASSIUM CHLORIDE CRYS ER 20 MEQ PO TBCR
20.0000 meq | EXTENDED_RELEASE_TABLET | Freq: Every day | ORAL | Status: DC
  Administered 2021-10-19 – 2021-10-22 (×4): 20 meq via ORAL
  Filled 2021-10-18 (×4): qty 1

## 2021-10-18 MED ORDER — POLYETHYLENE GLYCOL 3350 17 G PO PACK
17.0000 g | PACK | Freq: Every day | ORAL | Status: DC
  Administered 2021-10-19 – 2021-10-22 (×4): 17 g via ORAL
  Filled 2021-10-18 (×4): qty 1

## 2021-10-18 MED ORDER — MENTHOL 3 MG MT LOZG: 1.0000 | LOZENGE | OROMUCOSAL | Status: DC | PRN

## 2021-10-18 MED ORDER — CHLORHEXIDINE GLUCONATE CLOTH 2 % EX PADS: 6.0000 | MEDICATED_PAD | Freq: Once | CUTANEOUS | Status: DC

## 2021-10-18 MED ORDER — BENZTROPINE MESYLATE 1 MG PO TABS
1.0000 mg | ORAL_TABLET | Freq: Every day | ORAL | Status: DC
  Administered 2021-10-18 – 2021-10-21 (×4): 1 mg via ORAL
  Filled 2021-10-18 (×5): qty 1

## 2021-10-18 MED ORDER — SODIUM CHLORIDE 0.9% FLUSH: 3.0000 mL | INTRAVENOUS | Status: DC | PRN

## 2021-10-18 MED ORDER — MIDAZOLAM HCL 2 MG/2ML IJ SOLN
INTRAMUSCULAR | Status: AC
  Filled 2021-10-18: qty 2

## 2021-10-18 MED ORDER — LIDOCAINE-EPINEPHRINE 1 %-1:100000 IJ SOLN
INTRAMUSCULAR | Status: DC | PRN
  Administered 2021-10-18: 4 mL

## 2021-10-18 MED ORDER — OXYCODONE HCL 5 MG PO TABS
10.0000 mg | ORAL_TABLET | ORAL | Status: DC | PRN
  Administered 2021-10-19 – 2021-10-21 (×6): 10 mg via ORAL
  Filled 2021-10-18 (×7): qty 2

## 2021-10-18 MED ORDER — VITAMIN D 25 MCG (1000 UNIT) PO TABS
5000.0000 [IU] | ORAL_TABLET | Freq: Every day | ORAL | Status: DC
  Administered 2021-10-19 – 2021-10-22 (×4): 5000 [IU] via ORAL
  Filled 2021-10-18 (×5): qty 5

## 2021-10-18 MED ORDER — GLUCAGON 1 MG/0.2ML ~~LOC~~ SOAJ: 1.0000 mg | Freq: Every day | SUBCUTANEOUS | Status: DC | PRN

## 2021-10-18 MED ORDER — PHENYLEPHRINE HCL-NACL 20-0.9 MG/250ML-% IV SOLN
INTRAVENOUS | Status: DC | PRN
  Administered 2021-10-18: 40 ug/min via INTRAVENOUS

## 2021-10-18 MED ORDER — FENTANYL CITRATE (PF) 250 MCG/5ML IJ SOLN
INTRAMUSCULAR | Status: AC
  Filled 2021-10-18: qty 5

## 2021-10-18 MED ORDER — CEFAZOLIN SODIUM-DEXTROSE 2-4 GM/100ML-% IV SOLN
2.0000 g | Freq: Three times a day (TID) | INTRAVENOUS | Status: AC
  Administered 2021-10-18 – 2021-10-19 (×2): 2 g via INTRAVENOUS
  Filled 2021-10-18 (×3): qty 100

## 2021-10-18 MED ORDER — ONDANSETRON HCL 4 MG/2ML IJ SOLN: 4.0000 mg | Freq: Four times a day (QID) | INTRAMUSCULAR | Status: DC | PRN

## 2021-10-18 MED ORDER — RISPERIDONE 0.5 MG PO TABS
2.0000 mg | ORAL_TABLET | Freq: Every day | ORAL | Status: DC
  Administered 2021-10-18 – 2021-10-21 (×4): 2 mg via ORAL
  Filled 2021-10-18 (×4): qty 4

## 2021-10-18 MED ORDER — ONDANSETRON HCL 4 MG PO TABS: 4.0000 mg | ORAL_TABLET | Freq: Four times a day (QID) | ORAL | Status: DC | PRN

## 2021-10-18 MED ORDER — INSULIN DEGLUDEC 200 UNIT/ML ~~LOC~~ SOPN: PEN_INJECTOR | Freq: Every day | SUBCUTANEOUS | Status: DC

## 2021-10-18 MED ORDER — SENNA 8.6 MG PO TABS
1.0000 | ORAL_TABLET | Freq: Two times a day (BID) | ORAL | Status: DC
  Administered 2021-10-18 – 2021-10-22 (×8): 8.6 mg via ORAL
  Filled 2021-10-18 (×8): qty 1

## 2021-10-18 MED ORDER — PANTOPRAZOLE SODIUM 40 MG IV SOLR
40.0000 mg | Freq: Every day | INTRAVENOUS | Status: DC
  Administered 2021-10-18: 40 mg via INTRAVENOUS
  Filled 2021-10-18: qty 10

## 2021-10-18 MED ORDER — HYDROMORPHONE HCL 1 MG/ML IJ SOLN
INTRAMUSCULAR | Status: AC
  Filled 2021-10-18: qty 1

## 2021-10-18 MED ORDER — ACETAMINOPHEN 650 MG RE SUPP: 650.0000 mg | RECTAL | Status: DC | PRN

## 2021-10-18 MED ORDER — SODIUM CHLORIDE 0.9 % IV SOLN: INTRAVENOUS | Status: DC

## 2021-10-18 MED ORDER — FUROSEMIDE 40 MG PO TABS
40.0000 mg | ORAL_TABLET | Freq: Every day | ORAL | Status: DC
  Administered 2021-10-19 – 2021-10-22 (×4): 40 mg via ORAL
  Filled 2021-10-18 (×5): qty 1

## 2021-10-18 MED ORDER — INSULIN ASPART 100 UNIT/ML IJ SOLN: 0.0000 [IU] | INTRAMUSCULAR | Status: DC | PRN

## 2021-10-18 MED ORDER — BISACODYL 10 MG RE SUPP
10.0000 mg | Freq: Every day | RECTAL | Status: DC | PRN
  Administered 2021-10-21: 10 mg via RECTAL
  Filled 2021-10-18: qty 1

## 2021-10-18 MED ORDER — ACETAMINOPHEN 325 MG PO TABS
650.0000 mg | ORAL_TABLET | ORAL | Status: DC | PRN
  Administered 2021-10-18 – 2021-10-20 (×3): 650 mg via ORAL
  Filled 2021-10-18 (×3): qty 2

## 2021-10-18 MED ORDER — METHOCARBAMOL 500 MG PO TABS
500.0000 mg | ORAL_TABLET | Freq: Four times a day (QID) | ORAL | Status: DC | PRN
  Administered 2021-10-20: 500 mg via ORAL
  Filled 2021-10-18: qty 1

## 2021-10-18 MED ORDER — ATORVASTATIN CALCIUM 40 MG PO TABS
40.0000 mg | ORAL_TABLET | Freq: Every day | ORAL | Status: DC
  Administered 2021-10-18 – 2021-10-21 (×4): 40 mg via ORAL
  Filled 2021-10-18 (×4): qty 1

## 2021-10-18 MED ORDER — VENLAFAXINE HCL ER 75 MG PO CP24
225.0000 mg | ORAL_CAPSULE | Freq: Every day | ORAL | Status: DC
  Administered 2021-10-19 – 2021-10-22 (×4): 225 mg via ORAL
  Filled 2021-10-18 (×6): qty 3

## 2021-10-18 MED ORDER — MELOXICAM 7.5 MG PO TABS
15.0000 mg | ORAL_TABLET | Freq: Every day | ORAL | Status: DC
  Administered 2021-10-19 – 2021-10-22 (×4): 15 mg via ORAL
  Filled 2021-10-18 (×5): qty 2

## 2021-10-18 MED ORDER — DAPAGLIFLOZIN PROPANEDIOL 10 MG PO TABS
10.0000 mg | ORAL_TABLET | Freq: Every day | ORAL | Status: DC
  Administered 2021-10-19 – 2021-10-22 (×4): 10 mg via ORAL
  Filled 2021-10-18 (×5): qty 1

## 2021-10-18 MED ORDER — LIDOCAINE-EPINEPHRINE 1 %-1:100000 IJ SOLN
INTRAMUSCULAR | Status: AC
  Filled 2021-10-18: qty 1

## 2021-10-18 MED ORDER — METOPROLOL SUCCINATE ER 100 MG PO TB24
100.0000 mg | ORAL_TABLET | Freq: Every day | ORAL | Status: DC
  Administered 2021-10-19 – 2021-10-22 (×4): 100 mg via ORAL
  Filled 2021-10-18 (×4): qty 1

## 2021-10-18 MED ORDER — INSULIN ASPART PROT & ASPART (70-30 MIX) 100 UNIT/ML PEN
35.0000 [IU] | PEN_INJECTOR | Freq: Every day | SUBCUTANEOUS | Status: DC
  Filled 2021-10-18: qty 3

## 2021-10-18 MED ORDER — DEXAMETHASONE SODIUM PHOSPHATE 10 MG/ML IJ SOLN
INTRAMUSCULAR | Status: DC | PRN
  Administered 2021-10-18: 10 mg via INTRAVENOUS

## 2021-10-18 MED ORDER — BUPIVACAINE HCL (PF) 0.5 % IJ SOLN
INTRAMUSCULAR | Status: DC | PRN
  Administered 2021-10-18: 4 mL

## 2021-10-18 MED ORDER — OXYCODONE HCL 5 MG PO TABS
5.0000 mg | ORAL_TABLET | ORAL | Status: DC | PRN
  Administered 2021-10-18: 5 mg via ORAL
  Filled 2021-10-18: qty 1

## 2021-10-18 MED ORDER — ONDANSETRON HCL 4 MG/2ML IJ SOLN
INTRAMUSCULAR | Status: DC | PRN
  Administered 2021-10-18: 4 mg via INTRAVENOUS

## 2021-10-18 MED ORDER — SUGAMMADEX SODIUM 200 MG/2ML IV SOLN
INTRAVENOUS | Status: DC | PRN
  Administered 2021-10-18 (×2): 200 mg via INTRAVENOUS

## 2021-10-18 MED ORDER — LINACLOTIDE 145 MCG PO CAPS
145.0000 ug | ORAL_CAPSULE | Freq: Every day | ORAL | Status: DC
  Administered 2021-10-19 – 2021-10-22 (×4): 145 ug via ORAL
  Filled 2021-10-18 (×4): qty 1

## 2021-10-18 MED ORDER — ORAL CARE MOUTH RINSE: 15.0000 mL | Freq: Once | OROMUCOSAL | Status: AC

## 2021-10-18 MED ORDER — BUPIVACAINE HCL (PF) 0.5 % IJ SOLN
INTRAMUSCULAR | Status: AC
  Filled 2021-10-18: qty 30

## 2021-10-18 MED ORDER — 0.9 % SODIUM CHLORIDE (POUR BTL) OPTIME
TOPICAL | Status: DC | PRN
  Administered 2021-10-18: 500 mL

## 2021-10-18 MED ORDER — MORPHINE SULFATE (PF) 2 MG/ML IV SOLN: 2.0000 mg | INTRAVENOUS | Status: DC | PRN

## 2021-10-18 MED ORDER — METHYLPREDNISOLONE ACETATE 80 MG/ML IJ SUSP
INTRAMUSCULAR | Status: AC
  Filled 2021-10-18: qty 1

## 2021-10-18 MED ORDER — PROPOFOL 10 MG/ML IV BOLUS
INTRAVENOUS | Status: AC
  Filled 2021-10-18: qty 20

## 2021-10-18 MED ORDER — PHENOL 1.4 % MT LIQD: 1.0000 | OROMUCOSAL | Status: DC | PRN

## 2021-10-18 MED ORDER — GABAPENTIN 300 MG PO CAPS
300.0000 mg | ORAL_CAPSULE | Freq: Three times a day (TID) | ORAL | Status: DC
  Administered 2021-10-18 – 2021-10-22 (×11): 300 mg via ORAL
  Filled 2021-10-18 (×11): qty 1

## 2021-10-18 MED ORDER — LIDOCAINE 2% (20 MG/ML) 5 ML SYRINGE
INTRAMUSCULAR | Status: DC | PRN
  Administered 2021-10-18: 40 mg via INTRAVENOUS
  Administered 2021-10-18: 60 mg via INTRAVENOUS

## 2021-10-18 MED ORDER — PROPOFOL 500 MG/50ML IV EMUL
INTRAVENOUS | Status: DC | PRN
  Administered 2021-10-18: 20 ug/kg/min via INTRAVENOUS

## 2021-10-18 MED ORDER — LACTATED RINGERS IV SOLN: INTRAVENOUS | Status: DC

## 2021-10-18 MED ORDER — MIDAZOLAM HCL 2 MG/2ML IJ SOLN
INTRAMUSCULAR | Status: DC | PRN
  Administered 2021-10-18: 1 mg via INTRAVENOUS

## 2021-10-18 MED ORDER — THROMBIN 5000 UNITS EX SOLR
CUTANEOUS | Status: AC
  Filled 2021-10-18: qty 5000

## 2021-10-18 SURGICAL SUPPLY — 58 items
BAG COUNTER SPONGE SURGICOUNT (BAG) ×1 IMPLANT
BAND RUBBER #18 3X1/16 STRL (MISCELLANEOUS) ×2 IMPLANT
BENZOIN TINCTURE PRP APPL 2/3 (GAUZE/BANDAGES/DRESSINGS) IMPLANT
BLADE 11 SAFETY STRL DISP (BLADE) IMPLANT
BLADE CLIPPER SURG (BLADE) IMPLANT
BLADE SURG 11 STRL SS (BLADE) ×1 IMPLANT
BUR MATCHSTICK NEURO 3.0 LAGG (BURR) ×1 IMPLANT
BUR PRECISION FLUTE 5.0 (BURR) IMPLANT
CANISTER SUCT 3000ML PPV (MISCELLANEOUS) ×1 IMPLANT
CARTRIDGE OIL MAESTRO DRILL (MISCELLANEOUS) ×1 IMPLANT
DERMABOND ADVANCED .7 DNX12 (GAUZE/BANDAGES/DRESSINGS) ×1 IMPLANT
DERMABOND ADVANCED .7 DNX6 (GAUZE/BANDAGES/DRESSINGS) IMPLANT
DIFFUSER DRILL AIR PNEUMATIC (MISCELLANEOUS) ×1 IMPLANT
DRAPE LAPAROTOMY 100X72X124 (DRAPES) ×1 IMPLANT
DRAPE MICROSCOPE SLANT 54X150 (MISCELLANEOUS) ×1 IMPLANT
DRAPE SURG 17X23 STRL (DRAPES) ×1 IMPLANT
DRSG OPSITE POSTOP 3X4 (GAUZE/BANDAGES/DRESSINGS) ×1 IMPLANT
DRSG OPSITE POSTOP 4X6 (GAUZE/BANDAGES/DRESSINGS) IMPLANT
DURAPREP 26ML APPLICATOR (WOUND CARE) ×1 IMPLANT
ELECT REM PT RETURN 9FT ADLT (ELECTROSURGICAL) ×1
ELECTRODE REM PT RTRN 9FT ADLT (ELECTROSURGICAL) ×1 IMPLANT
GAUZE 4X4 16PLY ~~LOC~~+RFID DBL (SPONGE) IMPLANT
GAUZE SPONGE 4X4 12PLY STRL (GAUZE/BANDAGES/DRESSINGS) IMPLANT
GLOVE BIO SURGEON STRL SZ7.5 (GLOVE) IMPLANT
GLOVE BIOGEL PI IND STRL 6.5 (GLOVE) IMPLANT
GLOVE BIOGEL PI IND STRL 7.5 (GLOVE) ×2 IMPLANT
GLOVE ECLIPSE 7.0 STRL STRAW (GLOVE) ×1 IMPLANT
GLOVE EXAM NITRILE XL STR (GLOVE) IMPLANT
GOWN STRL REUS W/ TWL LRG LVL3 (GOWN DISPOSABLE) ×2 IMPLANT
GOWN STRL REUS W/ TWL XL LVL3 (GOWN DISPOSABLE) IMPLANT
GOWN STRL REUS W/TWL 2XL LVL3 (GOWN DISPOSABLE) IMPLANT
GOWN STRL REUS W/TWL LRG LVL3 (GOWN DISPOSABLE) ×2
GOWN STRL REUS W/TWL XL LVL3 (GOWN DISPOSABLE)
HEMOSTAT POWDER KIT SURGIFOAM (HEMOSTASIS) ×1 IMPLANT
KIT BASIN OR (CUSTOM PROCEDURE TRAY) ×1 IMPLANT
KIT TURNOVER KIT B (KITS) ×1 IMPLANT
NDL HYPO 18GX1.5 BLUNT FILL (NEEDLE) IMPLANT
NDL SPNL 18GX3.5 QUINCKE PK (NEEDLE) IMPLANT
NEEDLE HYPO 18GX1.5 BLUNT FILL (NEEDLE) IMPLANT
NEEDLE HYPO 22GX1.5 SAFETY (NEEDLE) ×1 IMPLANT
NEEDLE SPNL 18GX3.5 QUINCKE PK (NEEDLE) IMPLANT
NS IRRIG 1000ML POUR BTL (IV SOLUTION) ×1 IMPLANT
OIL CARTRIDGE MAESTRO DRILL (MISCELLANEOUS) ×1
PACK LAMINECTOMY NEURO (CUSTOM PROCEDURE TRAY) ×1 IMPLANT
PAD ARMBOARD 7.5X6 YLW CONV (MISCELLANEOUS) ×3 IMPLANT
SEALANT ADHERUS EXTEND TIP (MISCELLANEOUS) IMPLANT
SPIKE FLUID TRANSFER (MISCELLANEOUS) ×1 IMPLANT
SPONGE SURGIFOAM ABS GEL SZ50 (HEMOSTASIS) ×1 IMPLANT
SPONGE T-LAP 4X18 ~~LOC~~+RFID (SPONGE) IMPLANT
STRIP CLOSURE SKIN 1/2X4 (GAUZE/BANDAGES/DRESSINGS) IMPLANT
SUT VIC AB 0 CT1 18XCR BRD8 (SUTURE) ×1 IMPLANT
SUT VIC AB 0 CT1 8-18 (SUTURE) ×1
SUT VIC AB 2-0 CT1 18 (SUTURE) IMPLANT
SUT VICRYL 3-0 RB1 18 ABS (SUTURE) ×2 IMPLANT
SYR 3ML LL SCALE MARK (SYRINGE) IMPLANT
TOWEL GREEN STERILE (TOWEL DISPOSABLE) ×1 IMPLANT
TOWEL GREEN STERILE FF (TOWEL DISPOSABLE) ×1 IMPLANT
WATER STERILE IRR 1000ML POUR (IV SOLUTION) ×1 IMPLANT

## 2021-10-18 NOTE — Op Note (Signed)
NEUROSURGERY OPERATIVE NOTE   PREOP DIAGNOSIS: Lumbar disc herniation, left L2-3  POSTOP DIAGNOSIS: Same  PROCEDURE: 1. Left L2-3 laminotomy and microdiscectomy for decompression of nerve root 2. Use of operating microscope  SURGEON: Dr. Lisbeth Renshaw, MD  ASSISTANT: None  ANESTHESIA: General Endotracheal  EBL: 25cc  SPECIMENS: None  DRAINS: None  COMPLICATIONS: None immediate  CONDITION: Hemodynamically stable to PACU  HISTORY: Kristina Foster is a 56 y.o. female with a history of multiple previous lumbar surgeries who presented to the office with several weeks of back and primarily left-sided leg pain.  Her imaging did reveal a relatively large slightly superiorly migrated disc herniation on the left at L2-3 likely responsible for her new symptoms.  She therefore elected to proceed with surgical decompression.  The risks, benefits, and alternatives to surgery were reviewed in detail with the patient's mother who is her caregiver as the patient does have a developmental delay.  After all questions were answered informed consent was obtained and witnessed.  PROCEDURE IN DETAIL: After informed consent was obtained and witnessed, the patient was brought to the operating room. After induction of general anesthesia, the patient was positioned on the operative table in the prone position with all pressure points meticulously padded. The skin of the low back was then prepped and draped in the usual sterile fashion.  Spinal needle was introduced in order to identify the surface projection of the L2-3 interspace.  The skin was infiltrated with local anesthetic.  The superior portion of the previous skin incision was then opened sharply and Bovie electrocautery was used to dissect the subcutaneous tissue until the lumbodorsal fascia was identified. The fashion was then incised using Bovie electrocautery and the lamina at the L2 levels was identified and dissection was carried out in the  subperiosteal plane. Self-retaining retractor was then placed, and intraoperative x-ray with a dissector in the L2-3 interspace was taken to confirm we were at the correct level.  Using a high-speed drill, laminotomy was completed with a partial medial facetectomy on the left side at L2.. The ligamentum flavum was then identified and removed and the lateral edge of the thecal sac was identified.  In order to gain more lateral exposure I did drill out a little bit more of the medial aspect of the facet complex.  I was then able to identify the ventral epidural space.  Using a blunt dissector, I began to dissect the thecal sac away from the large herniated disc.  During this process, it became clear that the ventral dura was quite adherent to the posterior longitudinal ligament and the disc pseudocapsule.  I did note egress of some CSF as well as herniation of some of the cauda equina nerve roots into the field.  These were protected with a small cottonoid.  I then incised the disc capsule and using a combination of small dissectors, multiple herniated disc fragments were removed with rongeur's.  Once this was done it appeared that the ventral epidural space was well decompressed by easy passage of a ball-tipped dissector.  Hemostasis was then secured using a combination of morcellized Gelfoam and thrombin and bipolar electrocautery. The wound is irrigated with copious amounts of antibiotic saline irrigation.  The durotomy appeared to be on the ventral surface of the dura and was not amenable to primary closure.  I therefore cover the region with a layer of polyethylene glycol sealant.  Self-retaining retractors were then removed.  Hemostasis on the muscle edges was secured.  The wound  was then closed in multiple layers using a combination of 0 and 3-0 Vicryl stitches.  Skin was closed with interrupted 3-0 subcuticular stitch.  A layer of Dermabond was applied.  A sterile dressing was then placed after the  Dermabond dried.  At the end of the case all sponge, needle, and instrument counts were correct. The patient was then transferred to the stretcher and taken to the postanesthesia care unit in stable hemodynamic condition.   Kristina Lose, MD [facility]

## 2021-10-18 NOTE — Transfer of Care (Signed)
Immediate Anesthesia Transfer of Care Note  Patient: ANANDA CAYA  Procedure(s) Performed: MICRODISCECTOMY L2-3 (Left: Spine Lumbar)  Patient Location: PACU  Anesthesia Type:General  Level of Consciousness: awake and drowsy  Airway & Oxygen Therapy: Patient Spontanous Breathing and Patient connected to face mask oxygen  Post-op Assessment: Report given to RN and Post -op Vital signs reviewed and stable  Post vital signs: Reviewed and stable  Last Vitals:  Vitals Value Taken Time  BP 140/91 10/18/21 1609  Temp    Pulse 78 10/18/21 1612  Resp 15 10/18/21 1612  SpO2 100 % 10/18/21 1612  Vitals shown include unvalidated device data.  Last Pain:  Vitals:   10/18/21 1056  PainSc: 0-No pain         Complications: No notable events documented.

## 2021-10-18 NOTE — H&P (Signed)
Chief Complaint  Back and leg pain  History of Present Illness  Kristina Foster is a 56 y.o. female with a previous history of lumbar fusion and subsequent right-sided L3-4 microdiscectomy in 2020.  Unfortunately, patient suffered a fall approximately 2 months ago while at home.  Since then she has been complaining of primarily low back and left greater than right leg pain.  She complains of subjective weakness in her legs with difficulty walking and the feeling that her legs are giving out.  Her imaging has revealed a large left-sided L2-3 disc herniation.  She presents today for operative decompression.  Past Medical History   Past Medical History:  Diagnosis Date   Anxiety    Arthritis    Constipation    Depression    Diabetes mellitus without complication (HCC)    Type 2    Dizziness    Hyperlipemia    Hypertension    Mental disability    Right bundle branch block (RBBB)    Schizo affective schizophrenia [facility])     Past Surgical History   Past Surgical History:  Procedure Laterality Date   ABDOMINAL HYSTERECTOMY     BACK SURGERY     COLONOSCOPY WITH PROPOFOL N/A 11/11/2018   Procedure: COLONOSCOPY WITH PROPOFOL;  Surgeon: Earline Mayotte, MD;  Location: [facility] ENDOSCOPY;  Service: Endoscopy;  Laterality: N/A;   ESOPHAGOGASTRODUODENOSCOPY (EGD) WITH PROPOFOL N/A 11/11/2018   Procedure: ESOPHAGOGASTRODUODENOSCOPY (EGD) WITH PROPOFOL;  Surgeon: Earline Mayotte, MD;  Location: [facility] ENDOSCOPY;  Service: Endoscopy;  Laterality: N/A;    Social History   Social History   Tobacco Use   Smoking status: Never   Smokeless tobacco: Never  Vaping Use   Vaping Use: Never used  Substance Use Topics   Alcohol use: No   Drug use: No    Medications   Prior to Admission medications   Medication Sig Start Date End Date Taking? Authorizing Provider  acetaminophen (TYLENOL) 650 MG CR tablet Take 650 mg by mouth every 8 (eight) hours as needed for pain.   Yes [provider]  atorvastatin (LIPITOR) 40 MG tablet Take 40 mg by mouth at bedtime.   Yes [provider]  benztropine (COGENTIN) 1 MG tablet Take 1 mg by mouth at bedtime.   Yes [provider]  Cholecalciferol 125 MCG (5000 UT) TABS Take 5,000 Units by mouth daily.   Yes [provider]  estradiol (ESTRACE) 0.5 MG tablet Take 0.5 mg by mouth daily.   Yes [provider]  FARXIGA 10 MG TABS tablet Take 10 mg by mouth daily. 09/17/21  Yes [provider]  furosemide (LASIX) 40 MG tablet Take 40 mg by mouth daily. 11/08/15  Yes [provider]  gabapentin (NEURONTIN) 300 MG capsule Take 300 mg by mouth 3 (three) times daily.   Yes [provider]  Glucagon 1 MG/0.2ML SOAJ Inject 1 mg into the skin daily as needed (low blood sugar). 06/19/21  Yes [provider]  LINZESS 145 MCG CAPS capsule Take 145 mcg by mouth daily. 08/30/21  Yes [provider]  meloxicam (MOBIC) 15 MG tablet Take 15 mg by mouth daily. 09/06/21  Yes [provider]  metoprolol succinate (TOPROL-XL) 100 MG 24 hr tablet Take 100 mg by mouth daily.   Yes [provider]  NOVOLOG MIX 70/30 FLEXPEN (70-30) 100 UNIT/ML FlexPen Inject 35 Units into the skin daily with breakfast.  11/15/15  Yes [provider]  PARoxetine (PAXIL) 10  MG tablet Take 10 mg by mouth daily. 09/06/21  Yes [provider]  polyethylene glycol (MIRALAX / GLYCOLAX) 17 g packet Take 17 g by mouth daily.   Yes [provider]  potassium chloride SA (K-DUR,KLOR-CON) 20 MEQ tablet Take 20 mEq by mouth daily. 11/08/15  Yes [provider]  risperiDONE (RISPERDAL) 2 MG tablet Take 2 mg by mouth at bedtime.   Yes [provider]  TRESIBA FLEXTOUCH 200 UNIT/ML FlexTouch Pen Inject 35 Units into the skin daily. 08/08/21  Yes [provider]  TRULICITY 9.56 YO/3.7CH SOPN Inject 0.75 mg into the skin every Monday. 11/10/15  Yes  [provider]  venlafaxine XR (EFFEXOR-XR) 150 MG 24 hr capsule Take 150 mg by mouth daily. 10/05/21  Yes [provider]  venlafaxine XR (EFFEXOR-XR) 75 MG 24 hr capsule Take 75 mg by mouth daily. 10/05/21  Yes [provider]  aspirin EC 81 MG tablet Take 81 mg by mouth daily.    [provider]    Allergies  No Known Allergies  Review of Systems  ROS  Neurologic Exam  Awake, alert, oriented Memory and concentration grossly intact Speech fluent, appropriate CN grossly intact Motor exam: Upper Extremities Deltoid Bicep Tricep Grip  Right 5/5 5/5 5/5 5/5  Left 5/5 5/5 5/5 5/5   Lower Extremities IP Quad PF DF EHL  Right 5/5 5/5 5/5 5/5 5/5  Left 5/5 5/5 5/5 5/5 5/5   Sensation grossly intact to LT  Imaging  MRI of the lumbar spine dated 08/20/2021 was personally reviewed.  This demonstrates susceptibility artifact from previous lumbar fusion at L4-5.  There is a broad-based disc bulge at L3-4 although in comparison to the previous MRI of 2020 the large extruded fragment is no longer visible.  The new finding is on the left at L2-3 where there is a relatively large superiorly migrated herniated disc with associated lateral recess and central stenosis.  Impression  - 56 y.o. female with 2 months of back and left greater than right leg pain associate with subjective leg weakness resulting in multiple falls and trips to the emergency department.  She does have a large left eccentric herniated disc at L2-3 likely responsible for the majority of her symptoms.  Plan  -We will plan on proceeding with left L2-3 laminotomy and microdiscectomy. -Patient's caregiver due to her developmental delay is her mother who is now 71 years old and has a hard time caring for her at home.  She is unlikely to be able to care for her in the postoperative setting after spinal surgery.  We will therefore plan on admitting the patient postoperatively for physical and  Occupational Therapy evaluation and potential placement.  I have reviewed the indications for the surgical procedure as well as the details of the procedure and the expected postoperative course and recovery at length with the patient's mother in the office. We have also reviewed in detail the risks, benefits, and alternatives to the procedure. All questions were answered and patient's mother provided informed consent to proceed.  Consuella Lose, MD [facility]

## 2021-10-18 NOTE — Anesthesia Postprocedure Evaluation (Signed)
Anesthesia Post Note  Patient: Kristina Foster  Procedure(s) Performed: MICRODISCECTOMY L2-3 (Left: Spine Lumbar)     Patient location during evaluation: PACU Anesthesia Type: General Level of consciousness: awake Pain management: pain level controlled Vital Signs Assessment: post-procedure vital signs reviewed and stable Respiratory status: spontaneous breathing Cardiovascular status: stable Postop Assessment: no apparent nausea or vomiting Anesthetic complications: no   No notable events documented.  Last Vitals:  Vitals:   10/18/21 1630 10/18/21 1645  BP: (!) 144/94 (!) 162/97  Pulse: 77 71  Resp: 20 18  Temp:    SpO2: 96% 97%    Last Pain:  Vitals:   10/18/21 1645  PainSc: 0-No pain                 Naliya Gish

## 2021-10-18 NOTE — Anesthesia Procedure Notes (Signed)
Procedure Name: Intubation Date/Time: 10/18/2021 1:42 PM  Performed by: Dorthea Cove, CRNAPre-anesthesia Checklist: Patient identified, Emergency Drugs available, Suction available and Patient being monitored Patient Re-evaluated:Patient Re-evaluated prior to induction Oxygen Delivery Method: Circle system utilized Preoxygenation: Pre-oxygenation with 100% oxygen Induction Type: IV induction Ventilation: Mask ventilation without difficulty Laryngoscope Size: Mac and 3 Grade View: Grade I Tube type: Oral Tube size: 7.0 mm Number of attempts: 1 Airway Equipment and Method: Stylet and Oral airway Placement Confirmation: ETT inserted through vocal cords under direct vision, positive ETCO2 and breath sounds checked- equal and bilateral Secured at: 23 cm Tube secured with: Tape Dental Injury: Teeth and Oropharynx as per pre-operative assessment

## 2021-10-19 ENCOUNTER — Encounter (HOSPITAL_COMMUNITY): Payer: Self-pay | Admitting: Neurosurgery

## 2021-10-19 LAB — GLUCOSE, CAPILLARY
Glucose-Capillary: 192 mg/dL — ABNORMAL HIGH (ref 70–99)
Glucose-Capillary: 221 mg/dL — ABNORMAL HIGH (ref 70–99)
Glucose-Capillary: 227 mg/dL — ABNORMAL HIGH (ref 70–99)
Glucose-Capillary: 203 mg/dL — ABNORMAL HIGH (ref 70–99)

## 2021-10-19 MED ORDER — PANTOPRAZOLE SODIUM 40 MG PO TBEC
40.0000 mg | DELAYED_RELEASE_TABLET | Freq: Every day | ORAL | Status: DC
  Administered 2021-10-19 – 2021-10-21 (×3): 40 mg via ORAL
  Filled 2021-10-19 (×3): qty 1

## 2021-10-19 MED ORDER — THROMBIN 5000 UNITS EX SOLR
OROMUCOSAL | Status: DC | PRN
  Administered 2021-10-18: 5 mL

## 2021-10-19 NOTE — TOC Initial Note (Addendum)
Transition of Care [facility]) - Initial/Assessment Note    Patient Details  Name: Kristina Foster MRN: [id number] Date of Birth: Apr 04, 1965  Transition of Care [facility]) CM/SW Contact:    Kermit Balo, RN Phone Number: 10/19/2021, 10:51 AM  Clinical Narrative:                 Pt is from home with her guardian. CM called and spoke to her guardian, [name]. She states pt is independent with most of her ADL's at home. [name] does oversee her medications and does the cooking. [name] is 56 yo and is in agreement with pt needing some rehab before returning home. She states pt has been to [facility] in the past but wants to see if [facility] will offer first as this is closer to their home. CM has sent FL2 to [facility] and [facility].  TOC following.  1612: [facility] has offered a bed and pts Guardian has accepted. [place] checking on bed availability over the weekend.  Expected Discharge Plan: Skilled Nursing Facility Barriers to Discharge: Continued Medical Work up   Patient Goals and CMS Choice   CMS Medicare.gov Compare Post Acute Care list provided to:: Patient Represenative (must comment) Choice offered to / list presented to : [facility] POA / [facility] pt lives with)  Expected Discharge Plan and Services Expected Discharge Plan: Skilled Nursing Facility   Discharge Planning Services: CM Consult Post Acute Care Choice: Skilled Nursing Facility Living arrangements for the past 2 months: Single Family Home                                      Prior Living Arrangements/Services Living arrangements for the past 2 months: Single Family Home Lives with:: Other (Comment) Patient language and need for interpreter reviewed:: Yes Do you feel safe going back to the place where you live?: Yes      Need for Family Participation in Patient Care: Yes (Comment) Care giver support system in place?: No (comment)   Criminal Activity/Legal Involvement Pertinent to Current  Situation/Hospitalization: No - Comment as needed  Activities of Daily Living Home Assistive Devices/Equipment: None ADL Screening (condition at time of admission) Patient's cognitive ability adequate to safely complete daily activities?: Yes Is the patient deaf or have difficulty hearing?: No Does the patient have difficulty seeing, even when wearing glasses/contacts?: No Does the patient have difficulty concentrating, remembering, or making decisions?: Yes Patient able to express need for assistance with ADLs?: Yes Does the patient have difficulty dressing or bathing?: No Independently performs ADLs?: Yes (appropriate for developmental age) Does the patient have difficulty walking or climbing stairs?: Yes Weakness of Legs: None Weakness of Arms/Hands: None  Permission Sought/Granted                  Emotional Assessment Appearance:: Appears stated age     Orientation: : Oriented to Self, Oriented to Place, Oriented to  Time, Oriented to Situation   Psych Involvement: No (comment)  Admission diagnosis:  HNP (herniated nucleus pulposus), lumbar [M51.26] Patient Active Problem List   Diagnosis Date Noted   HNP (herniated nucleus pulposus), lumbar 10/18/2021   Chronic ITP (idiopathic thrombocytopenia) (HCC) 04/20/2019   Thrombocytopenia (HCC) 03/18/2018   Syncope 12/08/2015   Lumbar spondylosis 08/02/2014   PCP:  Armando Gang, FNP Pharmacy:   [facility] - [place], [place] - [address]  [place] [address] Phone: [phone] Fax: [phone]     Social Determinants of Health (SDOH) Interventions    Readmission Risk Interventions     No data to display

## 2021-10-19 NOTE — Evaluation (Signed)
Occupational Therapy Evaluation Patient Details Name: Kristina Foster MRN: [id number] DOB: Dec 19, 1965 Today's Date: 10/19/2021   History of Present Illness Kristina Foster is a 56 y.o. female with a previous history of lumbar fusion and subsequent right-sided L3-4 microdiscectomy in 2020.  Admitted with LE weakness and pain since fall 2 months prior.  Her imaging has revealed a large left-sided L2-3 disc herniation.  She underwent L2-3 decompression/discectomy on 10/18/21.  PMH positive for DM, RBBB, schizophrenia, HTN, HLD, and developmental delay.   Clinical Impression   [name] presents to OT with limitations d/t pain, generalized weakness, and dynamic balance deficits. She was very perseverative on pain throughout the session. Mod A overall for ADLs and transfres. Pt would benefit from continued acute OT services to facilitate safe d/c home and optimize occupational performance. Recommend SNF d/c.       Recommendations for follow up therapy are one component of a multi-disciplinary discharge planning process, led by the attending physician.  Recommendations may be updated based on patient status, additional functional criteria and insurance authorization.   Follow Up Recommendations  Skilled nursing-short term rehab (<3 hours/day)    Assistance Recommended at Discharge Frequent or constant Supervision/Assistance  Patient can return home with the following A lot of help with walking and/or transfers;A lot of help with bathing/dressing/bathroom    Functional Status Assessment  Patient has had a recent decline in their functional status and demonstrates the ability to make significant improvements in function in a reasonable and predictable amount of time.  Equipment Recommendations  None recommended by OT    Recommendations for Other Services       Precautions / Restrictions Precautions Precautions: Fall;Back Precaution Booklet Issued: No Restrictions Weight Bearing Restrictions:  No      Mobility Bed Mobility Overal bed mobility: Needs Assistance Bed Mobility: Supine to Sit Rolling: Min assist Sidelying to sit: Mod assist       General bed mobility comments: assist for lifting trunk and legs off bed    Transfers Overall transfer level: Needs assistance Equipment used: 1 person hand held assist Transfers: Bed to chair/wheelchair/BSC, Sit to/from Stand Sit to Stand: Mod assist Stand pivot transfers: Mod assist         General transfer comment: Lifting assist required with significant pain guarding      Balance Overall balance assessment: Needs assistance Sitting-balance support: Feet supported Sitting balance-Leahy Scale: Fair     Standing balance support: Bilateral upper extremity supported, Reliant on assistive device for balance Standing balance-Leahy Scale: Poor                             ADL either performed or assessed with clinical judgement   ADL Overall ADL's : Needs assistance/impaired Eating/Feeding: Set up   Grooming: Wash/dry hands;Wash/dry face;Supervision/safety   Upper Body Bathing: Supervision/ safety   Lower Body Bathing: Moderate assistance   Upper Body Dressing : Supervision/safety   Lower Body Dressing: Moderate assistance   Toilet Transfer: Moderate assistance;Stand-pivot   Toileting- Clothing Manipulation and Hygiene: Moderate assistance;Sit to/from stand       Functional mobility during ADLs: Moderate assistance General ADL Comments: Pt very limited by pain     Vision Baseline Vision/History: 0 No visual deficits Ability to See in Adequate Light: 0 Adequate Patient Visual Report: No change from baseline Vision Assessment?: No apparent visual deficits            Pertinent Vitals/Pain Pain Assessment Pain Assessment:  0-10 Pain Score: 7  Pain Location: back and L leg Pain Descriptors / Indicators: Grimacing, Guarding, Moaning Pain Intervention(s): Monitored during session     Hand  Dominance Right   Extremity/Trunk Assessment Upper Extremity Assessment Upper Extremity Assessment: Generalized weakness   Lower Extremity Assessment Lower Extremity Assessment: Generalized weakness   Cervical / Trunk Assessment Cervical / Trunk Assessment: Back Surgery   Communication Communication Communication: No difficulties   Cognition Arousal/Alertness: Awake/alert Behavior During Therapy: WFL for tasks assessed/performed Overall Cognitive Status: History of cognitive impairments - at baseline                                 General Comments: Pt perseverative on pain and difficult to redirect to task     General Comments  Pt limited by perseveration on pain            Home Living Family/patient expects to be discharged to:: Private residence Living Arrangements: Non-relatives/Friends Available Help at Discharge: Friend(s) Type of Home: Group Home Home Access: Stairs to enter Secretary/administrator of Steps: 4-5 Entrance Stairs-Rails: Right;Left Home Layout: One level     Bathroom Shower/Tub: Producer, television/film/video: Standard     Home Equipment: Shower seat;Grab bars - tub/shower   Additional Comments: Information taken from chart, pt unreliable historian      Prior Functioning/Environment Prior Level of Function : History of Falls (last six months);Patient poor historian/Family not available             Mobility Comments: reports was using a walker at home, had difficulty on stairs (fall happened on stairs)          OT Problem List: Decreased strength;Decreased activity tolerance;Impaired balance (sitting and/or standing);Decreased cognition;Decreased safety awareness;Decreased knowledge of precautions;Decreased knowledge of use of DME or AE;Pain      OT Treatment/Interventions: Self-care/ADL training;Therapeutic exercise;Energy conservation;DME and/or AE instruction;Therapeutic activities;Cognitive  remediation/compensation;Patient/family education;Balance training    OT Goals(Current goals can be found in the care plan section) Acute Rehab OT Goals Patient Stated Goal: Eat lunch OT Goal Formulation: With patient Time For Goal Achievement: 11/01/21 Potential to Achieve Goals: Fair  OT Frequency: Min 2X/week       AM-PAC OT "6 Clicks" Daily Activity     Outcome Measure Help from another person eating meals?: A Little Help from another person taking care of personal grooming?: A Little Help from another person toileting, which includes using toliet, bedpan, or urinal?: A Lot Help from another person bathing (including washing, rinsing, drying)?: A Lot Help from another person to put on and taking off regular upper body clothing?: A Little Help from another person to put on and taking off regular lower body clothing?: A Lot 6 Click Score: 15   End of Session Equipment Utilized During Treatment: Gait belt Nurse Communication: Mobility status  Activity Tolerance: Patient limited by pain Patient left: in chair;with call bell/phone within reach;with chair alarm set  OT Visit Diagnosis: Muscle weakness (generalized) (M62.81);Repeated falls (R29.6);Unsteadiness on feet (R26.81);Pain Pain - Right/Left: Right Pain - part of body: Hip (back)                Time: 9381-0175 OT Time Calculation (min): 18 min Charges:  OT General Charges $OT Visit: 1 Visit OT Evaluation $OT Eval Moderate Complexity: 1 Mod  Jake Shark, OTR/L, CBIS Acute Rehab Office: [phone]   Crissie Reese 10/19/2021, 12:12 PM

## 2021-10-19 NOTE — Evaluation (Signed)
Physical Therapy Evaluation Patient Details Name: Kristina Foster MRN: [id number] DOB: 1965/02/21 Today's Date: 10/19/2021  History of Present Illness  Kristina Foster is a 56 y.o. female with a previous history of lumbar fusion and subsequent right-sided L3-4 microdiscectomy in 2020.  Admitted with LE weakness and pain since fall 2 months prior.  Her imaging has revealed a large left-sided L2-3 disc herniation.  She underwent L2-3 decompression/discectomy on 10/18/21.  PMH positive for DM, RBBB, schizophrenia, HTN, HLD, and developmental delay.  Clinical Impression  Patient presents with decreased mobility due to deficits listed in PT problem list.  She was painful and needed extra time and lots of encouragement to continue for in room ambulation.  She lives in one level home with steps to enter with her 43 y/o caregiver.  Reports ambulatory with RW prior to surgery.  She will benefit from skilled PT to progress mobility while in acute setting.  Feel will need STSNF level rehab prior to d/c home.      Recommendations for follow up therapy are one component of a multi-disciplinary discharge planning process, led by the attending physician.  Recommendations may be updated based on patient status, additional functional criteria and insurance authorization.  Follow Up Recommendations Skilled nursing-short term rehab (<3 hours/day) Can patient physically be transported by private vehicle: Yes    Assistance Recommended at Discharge Frequent or constant Supervision/Assistance  Patient can return home with the following  A lot of help with walking and/or transfers;A lot of help with bathing/dressing/bathroom;Assist for transportation;Help with stairs or ramp for entrance    Equipment Recommendations None recommended by PT  Recommendations for Other Services       Functional Status Assessment       Precautions / Restrictions Precautions Precautions: Fall;Back Precaution Booklet Issued: No       Mobility  Bed Mobility Overal bed mobility: Needs Assistance Bed Mobility: Rolling, Sidelying to Sit Rolling: Min assist Sidelying to sit: Mod assist       General bed mobility comments: assist for lifting trunk and legs off bed    Transfers Overall transfer level: Needs assistance Equipment used: Rolling walker (2 wheels) Transfers: Sit to/from Stand Sit to Stand: Mod assist           General transfer comment: lifting help after couple of trials with max encouragement as pt not wanting to continue when painful    Ambulation/Gait Ambulation/Gait assistance: Mod assist Gait Distance (Feet): 12 Feet (20') Assistive device: Rolling walker (2 wheels) Gait Pattern/deviations: Decreased stride length, Step-to pattern, Shuffle, Trunk flexed       General Gait Details: assist/encouragement for continuing despite pt stating unable to continue due to pain, walked to bathroom as pt needing to urinate, then walked with increased time and encouragement around bed to recliner.  Stairs            Wheelchair Mobility    Modified Rankin (Stroke Patients Only)       Balance Overall balance assessment: Needs assistance Sitting-balance support: Feet supported Sitting balance-Leahy Scale: Fair Sitting balance - Comments: toileting with 3:1 independent   Standing balance support: Bilateral upper extremity supported, Reliant on assistive device for balance Standing balance-Leahy Scale: Poor                               Pertinent Vitals/Pain Pain Assessment Pain Assessment: Faces Faces Pain Scale: Hurts whole lot Pain Location: back and L leg Pain Descriptors /  Indicators: Grimacing, Guarding, Moaning Pain Intervention(s): Repositioned, Monitored during session, Premedicated before session    Home Living Family/patient expects to be discharged to:: Private residence Living Arrangements: Non-relatives/Friends (lives with an 61 y/o lady who ran the group  home she used to live in)   Type of Home: Group Home Home Access: Stairs to enter Entrance Stairs-Rails: Doctor, general practice of Steps: 4-5   Home Layout: One level Home Equipment: Shower seat;Grab bars - tub/shower      Prior Function Prior Level of Function : History of Falls (last six months);Patient poor historian/Family not available             Mobility Comments: reports was using a walker at home, had difficulty on stairs (fall happened on stairs)       Hand Dominance   Dominant Hand: Right    Extremity/Trunk Assessment        Lower Extremity Assessment Lower Extremity Assessment: Generalized weakness    Cervical / Trunk Assessment Cervical / Trunk Assessment: Back Surgery  Communication   Communication: No difficulties  Cognition Arousal/Alertness: Awake/alert Behavior During Therapy: WFL for tasks assessed/performed Overall Cognitive Status: History of cognitive impairments - at baseline                                          General Comments      Exercises     Assessment/Plan    PT Assessment Patient needs continued PT services  PT Problem List Decreased strength;Decreased mobility;Decreased safety awareness;Decreased knowledge of precautions;Decreased activity tolerance;Impaired sensation;Decreased knowledge of use of DME;Decreased balance;Pain       PT Treatment Interventions DME instruction;Therapeutic exercise;Balance training;Gait training;Cognitive remediation;Functional mobility training;Therapeutic activities;Patient/family education;Stair training    PT Goals (Current goals can be found in the Care Plan section)  Acute Rehab PT Goals Patient Stated Goal: caregiver interested in SNF for pt prior to home PT Goal Formulation: Patient unable to participate in goal setting Time For Goal Achievement: 11/02/21 Potential to Achieve Goals: Fair    Frequency Min 3X/week     Co-evaluation                AM-PAC PT "6 Clicks" Mobility  Outcome Measure Help needed turning from your back to your side while in a flat bed without using bedrails?: A Lot Help needed moving from lying on your back to sitting on the side of a flat bed without using bedrails?: A Lot Help needed moving to and from a bed to a chair (including a wheelchair)?: A Lot Help needed standing up from a chair using your arms (e.g., wheelchair or bedside chair)?: A Lot Help needed to walk in hospital room?: A Lot Help needed climbing 3-5 steps with a railing? : Total 6 Click Score: 11    End of Session Equipment Utilized During Treatment: Gait belt Activity Tolerance: Patient limited by pain Patient left: in chair;with call bell/phone within reach;with chair alarm set   PT Visit Diagnosis: Other abnormalities of gait and mobility (R26.89);Pain;History of falling (Z91.81) Pain - Right/Left: Left Pain - part of body: Leg    Time: 0240-9735 PT Time Calculation (min) (ACUTE ONLY): 32 min   Charges:   PT Evaluation $PT Eval Moderate Complexity: 1 Mod PT Treatments $Gait Training: 8-22 mins        Sheran Lawless, PT Acute Rehabilitation Services Office:[phone] 10/19/2021  Elray Mcgregor 10/19/2021, 12:02 PM

## 2021-10-19 NOTE — Progress Notes (Signed)
  NEUROSURGERY PROGRESS NOTE   No issues overnight. Pt appears well, does have continued back and leg pain.  EXAM:  BP (!) 104/59 (BP Location: Right Arm)   Pulse 74   Temp (!) 97.4 F (36.3 C) (Oral)   Resp 16   Ht 5\' 2"  (1.575 m)   Wt 77.6 kg   SpO2 94%   BMI 31.29 kg/m   Awake, alert, oriented  Speech fluent, appropriate  CN grossly intact  5/5 BUE/BLE   IMPRESSION:  56 y.o. female POD#1 left L2-3 microdisc, appears to be doing reasonably well. Caregiver is her mother who is 89 yo, unlikely to be able to help care for her adequately in the immediate postop period and therefore will likely need SNF placement.  PLAN: - Mobilize with PT/OT today - CM/SW for likely d/c planning   Consuella Lose, MD [facility]

## 2021-10-19 NOTE — NC FL2 (Signed)
[place] LEVEL OF CARE SCREENING TOOL     IDENTIFICATION  Patient Name: Kristina Foster Birthdate: 1965-02-05 Sex: female Admission Date (Current Location): 10/18/2021  [facility] and [place] Number:  Engineering geologist and Address:  [facility], [place] [address] [place], [place] [address]      Provider Number: [id number]  Attending Physician Name and Address:  Consuella Lose, MD  Relative Name and Phone Number:       Current Level of Care: Hospital Recommended Level of Care: [place] Prior Approval Number:    Date Approved/Denied:   PASRR Number: [id number]  Discharge Plan: SNF    Current Diagnoses: Patient Active Problem List   Diagnosis Date Noted   HNP (herniated nucleus pulposus), lumbar 10/18/2021   Chronic ITP (idiopathic thrombocytopenia) (HCC) 04/20/2019   Thrombocytopenia ([place]) 03/18/2018   Syncope 12/08/2015   Lumbar spondylosis 08/02/2014    Orientation RESPIRATION BLADDER Height & Weight     Self, Time, Situation, Place  Normal Continent Weight: 77.6 kg Height:  5\' 2"  (157.5 cm)  BEHAVIORAL SYMPTOMS/MOOD NEUROLOGICAL BOWEL NUTRITION STATUS      Continent Diet (Heart healthy/ carb modifed with thin liquids)  AMBULATORY STATUS COMMUNICATION OF NEEDS Skin   Limited Assist Verbally Surgical wounds (incision to back with dressing)                       Personal Care Assistance Level of Assistance  Bathing, Feeding, Dressing Bathing Assistance: Limited assistance Feeding assistance: Independent Dressing Assistance: Limited assistance     Functional Limitations Info  Sight, Hearing, Speech Sight Info: Adequate Hearing Info: Adequate Speech Info: Adequate    SPECIAL CARE FACTORS FREQUENCY  PT (By licensed PT), OT (By licensed OT)     PT Frequency: 5x/wk OT Frequency: 5x/wk            Contractures Contractures Info: Not present    Additional Factors Info  Code Status,  Allergies, Psychotropic, Insulin Sliding Scale Code Status Info: Full Allergies Info: NKA Psychotropic Info: Cogentin 1 mg at bedtime/ Neurontin 300 mg TID/ Risperdal 2 mg at bedtime/ Effexor XR 225 mg in am Insulin Sliding Scale Info: Novolog 0-15 units SQ with meals/ Semglee 35 units SQ daily       Current Medications (10/19/2021):  This is the current hospital active medication list Current Facility-Administered Medications  Medication Dose Route Frequency Provider Last Rate Last Admin   0.9 %  sodium chloride infusion   Intravenous Continuous Consuella Lose, MD 75 mL/hr at 10/18/21 2235 New Bag at 10/18/21 2235   0.9 %  sodium chloride infusion  250 mL Intravenous Continuous Consuella Lose, MD       acetaminophen (TYLENOL) tablet 650 mg  650 mg Oral Q4H PRN Consuella Lose, MD   650 mg at 10/18/21 2130   Or   acetaminophen (TYLENOL) suppository 650 mg  650 mg Rectal Q4H PRN Consuella Lose, MD       atorvastatin (LIPITOR) tablet 40 mg  40 mg Oral QHS Consuella Lose, MD   40 mg at 10/18/21 2130   benztropine (COGENTIN) tablet 1 mg  1 mg Oral QHS Consuella Lose, MD   1 mg at 10/18/21 2236   bisacodyl (DULCOLAX) suppository 10 mg  10 mg Rectal Daily PRN Consuella Lose, MD       cholecalciferol (VITAMIN D3) 25 MCG (1000 UNIT) tablet 5,000 Units  5,000 Units Oral Daily Consuella Lose, MD   5,000  Units at 10/19/21 0847   dapagliflozin propanediol (FARXIGA) tablet 10 mg  10 mg Oral Daily Lisbeth Renshaw, MD       docusate sodium (COLACE) capsule 100 mg  100 mg Oral BID Lisbeth Renshaw, MD   100 mg at 10/19/21 0848   [START ON 10/22/2021] Dulaglutide SOPN 0.75 mg  0.75 mg Subcutaneous Q Leticia Clas, Marlane Hatcher, MD       estradiol (ESTRACE) tablet 0.5 mg  0.5 mg Oral Daily Lisbeth Renshaw, MD       furosemide (LASIX) tablet 40 mg  40 mg Oral Daily Lisbeth Renshaw, MD   40 mg at 10/19/21 0847   gabapentin (NEURONTIN) capsule 300 mg  300 mg Oral TID Lisbeth Renshaw, MD   300 mg at 10/19/21 0848   Glucagon SOAJ 1 mg  1 mg Subcutaneous Daily PRN Lisbeth Renshaw, MD       insulin aspart (novoLOG) injection 0-15 Units  0-15 Units Subcutaneous TID WC Lisbeth Renshaw, MD   3 Units at 10/19/21 0726   insulin glargine-yfgn (SEMGLEE) injection 35 Units  35 Units Subcutaneous Daily Lisbeth Renshaw, MD   35 Units at 10/19/21 1308   linaclotide (LINZESS) capsule 145 mcg  145 mcg Oral Daily Lisbeth Renshaw, MD       meloxicam (MOBIC) tablet 15 mg  15 mg Oral Daily Lisbeth Renshaw, MD       menthol-cetylpyridinium (CEPACOL) lozenge 3 mg  1 lozenge Oral PRN Lisbeth Renshaw, MD       Or   phenol (CHLORASEPTIC) mouth spray 1 spray  1 spray Mouth/Throat PRN Lisbeth Renshaw, MD       methocarbamol (ROBAXIN) tablet 500 mg  500 mg Oral Q6H PRN Lisbeth Renshaw, MD       Or   methocarbamol (ROBAXIN) 500 mg in dextrose 5 % 50 mL IVPB  500 mg Intravenous Q6H PRN Lisbeth Renshaw, MD       metoprolol succinate (TOPROL-XL) 24 hr tablet 100 mg  100 mg Oral Daily Lisbeth Renshaw, MD   100 mg at 10/19/21 0848   morphine (PF) 2 MG/ML injection 2 mg  2 mg Intravenous Q2H PRN Lisbeth Renshaw, MD       ondansetron (ZOFRAN) tablet 4 mg  4 mg Oral Q6H PRN Lisbeth Renshaw, MD       Or   ondansetron (ZOFRAN) injection 4 mg  4 mg Intravenous Q6H PRN Lisbeth Renshaw, MD       oxyCODONE (Oxy IR/ROXICODONE) immediate release tablet 10 mg  10 mg Oral Q3H PRN Lisbeth Renshaw, MD   10 mg at 10/19/21 0848   oxyCODONE (Oxy IR/ROXICODONE) immediate release tablet 5 mg  5 mg Oral Q3H PRN Lisbeth Renshaw, MD   5 mg at 10/18/21 2240   pantoprazole (PROTONIX) EC tablet 40 mg  40 mg Oral QHS Alphia Moh D, RPH       PARoxetine (PAXIL) tablet 10 mg  10 mg Oral Daily Lisbeth Renshaw, MD       polyethylene glycol (MIRALAX / GLYCOLAX) packet 17 g  17 g Oral Daily Lisbeth Renshaw, MD       potassium chloride SA (KLOR-CON M) CR tablet 20 mEq  20 mEq Oral  Daily Lisbeth Renshaw, MD   20 mEq at 10/19/21 0848   risperiDONE (RISPERDAL) tablet 2 mg  2 mg Oral QHS Lisbeth Renshaw, MD   2 mg at 10/18/21 2130   senna (SENOKOT) tablet 8.6 mg  1 tablet Oral BID Lisbeth Renshaw, MD   8.6 mg  at 10/19/21 0849   sodium chloride flush (NS) 0.9 % injection 3 mL  3 mL Intravenous Q12H Lisbeth Renshaw, MD   3 mL at 10/18/21 2242   sodium chloride flush (NS) 0.9 % injection 3 mL  3 mL Intravenous PRN Lisbeth Renshaw, MD       venlafaxine XR (EFFEXOR-XR) 24 hr capsule 225 mg  225 mg Oral Q breakfast Lisbeth Renshaw, MD   225 mg at 10/19/21 8185     Discharge Medications: Please see discharge summary for a list of discharge medications.  Relevant Imaging Results:  Relevant Lab Results:   Additional Information SS#: [id number]  Kermit Balo, RN

## 2021-10-20 LAB — GLUCOSE, CAPILLARY
Glucose-Capillary: 159 mg/dL — ABNORMAL HIGH (ref 70–99)
Glucose-Capillary: 210 mg/dL — ABNORMAL HIGH (ref 70–99)
Glucose-Capillary: 147 mg/dL — ABNORMAL HIGH (ref 70–99)
Glucose-Capillary: 273 mg/dL — ABNORMAL HIGH (ref 70–99)

## 2021-10-20 MED ORDER — HEPARIN SODIUM (PORCINE) 5000 UNIT/ML IJ SOLN
5000.0000 [IU] | Freq: Three times a day (TID) | INTRAMUSCULAR | Status: DC
  Administered 2021-10-20 – 2021-10-22 (×6): 5000 [IU] via SUBCUTANEOUS
  Filled 2021-10-20 (×6): qty 1

## 2021-10-20 NOTE — Progress Notes (Signed)
Neurosurgery Service Progress Note  Subjective: No acute events overnight, reports back pain, no leg pain, overall doing well, wants some ice cream   Objective: Vitals:   10/19/21 2123 10/19/21 2340 10/20/21 0307 10/20/21 0730  BP: 97/64 122/73 112/71 (!) 140/68  Pulse: 68 70 70 73  Resp: 17 15 11 17   Temp: 98.5 F (36.9 C) 97.6 F (36.4 C) 97.6 F (36.4 C) 99.6 F (37.6 C)  TempSrc: Axillary Axillary Axillary Oral  SpO2: 97% 95% 92% 94%  Weight:      Height:        Physical Exam: Strength 5/5 and SILT in BLE  Assessment & Plan: 56 y.o. woman s/p L2-3 discectomy, recovering well.  -SNF pending -activity / diet as tolerated -SCDs/TEDs, SQH  Judith Part  10/20/21 10:31 AM

## 2021-10-21 LAB — GLUCOSE, CAPILLARY
Glucose-Capillary: 174 mg/dL — ABNORMAL HIGH (ref 70–99)
Glucose-Capillary: 191 mg/dL — ABNORMAL HIGH (ref 70–99)
Glucose-Capillary: 138 mg/dL — ABNORMAL HIGH (ref 70–99)
Glucose-Capillary: 145 mg/dL — ABNORMAL HIGH (ref 70–99)

## 2021-10-21 NOTE — Progress Notes (Signed)
Patient ID: Kristina Foster, female   DOB: Apr 17, 1965, 56 y.o.   MRN: [id number] BP 122/74 (BP Location: Right Arm)   Pulse 72   Temp 97.8 F (36.6 C) (Oral)   Resp 16   Ht 5\' 2"  (1.575 m)   Wt 77.6 kg   SpO2 97%   BMI 31.29 kg/m  Alert, oriented x 4 Moving lower extremities Wound dressing is dry, no signs of infection Waiting on placement Complains of pain in the right lower extremity and back

## 2021-10-22 LAB — GLUCOSE, CAPILLARY
Glucose-Capillary: 180 mg/dL — ABNORMAL HIGH (ref 70–99)
Glucose-Capillary: 176 mg/dL — ABNORMAL HIGH (ref 70–99)

## 2021-10-22 MED ORDER — OXYCODONE HCL 5 MG PO TABS: 5.0000 mg | ORAL_TABLET | Freq: Four times a day (QID) | ORAL | 0 refills | Status: DC | PRN

## 2021-10-22 MED ORDER — OXYCODONE HCL 5 MG PO TABS: 5.0000 mg | ORAL_TABLET | Freq: Four times a day (QID) | ORAL | 0 refills | Status: AC | PRN

## 2021-10-22 MED ORDER — METHOCARBAMOL 500 MG PO TABS: 500.0000 mg | ORAL_TABLET | Freq: Three times a day (TID) | ORAL | 0 refills | Status: AC | PRN

## 2021-10-22 NOTE — Care Management Important Message (Signed)
Important Message  Patient Details  Name: Kristina Foster MRN: [id number] Date of Birth: April 20, 1965   Medicare Important Message Given:  Yes     Kelvis Berger 10/22/2021, 4:00 PM

## 2021-10-22 NOTE — Progress Notes (Signed)
Physical Therapy Treatment Patient Details Name: Kristina Foster MRN: [id number] DOB: March 25, 1965 Today's Date: 10/22/2021   History of Present Illness Kristina Foster is a 56 y.o. female with a previous history of lumbar fusion and subsequent right-sided L3-4 microdiscectomy in 2020.  Admitted with LE weakness and pain since fall 2 months prior.  Her imaging has revealed a large left-sided L2-3 disc herniation.  She underwent L2-3 decompression/discectomy on 10/18/21.  PMH positive for DM, RBBB, schizophrenia, HTN, HLD, and developmental delay.    PT Comments    Patient not progressing well towards PT goals due to perseveration on pain. Very difficult to redirect despite encouragement/coaxing and redirection. Pt anxious and fearful of movement due to pain. Requires Mod A for bed mobility and for standing. Able to take a few steps along side bed and forwards/backwards but declined any further movement, "I cannot walk!" Explained healing process, and importance of mobility etc. Pt plans to d/c to SNF which is appropriate. Will follow.    Recommendations for follow up therapy are one component of a multi-disciplinary discharge planning process, led by the attending physician.  Recommendations may be updated based on patient status, additional functional criteria and insurance authorization.  Follow Up Recommendations  Skilled nursing-short term rehab (<3 hours/day) Can patient physically be transported by private vehicle: Yes   Assistance Recommended at Discharge Frequent or constant Supervision/Assistance  Patient can return home with the following A lot of help with walking and/or transfers;A lot of help with bathing/dressing/bathroom;Assist for transportation;Help with stairs or ramp for entrance   Equipment Recommendations  None recommended by PT    Recommendations for Other Services       Precautions / Restrictions Precautions Precautions: Fall;Back Precaution Booklet Issued:  No Precaution Comments: Reviewed back precautions Restrictions Weight Bearing Restrictions: No     Mobility  Bed Mobility Overal bed mobility: Needs Assistance Bed Mobility: Rolling, Sidelying to Sit, Sit to Supine Rolling: Mod assist Sidelying to sit: Mod assist, HOB elevated   Sit to supine: Min guard, HOB elevated   General bed mobility comments: Step by step cues for sequencing to bring LEs off bed, roll, reach for rail and elevate trunk.    Transfers Overall transfer level: Needs assistance Equipment used: Rolling walker (2 wheels) Transfers: Sit to/from Stand Sit to Stand: Mod assist           General transfer comment: Mod A to power to standing with cues for hand placement/technique, fearful of pain to stand, "you need another person, they had 3 people in here earlier."    Ambulation/Gait Ambulation/Gait assistance: Min assist Gait Distance (Feet): 4 Feet Assistive device: Rolling walker (2 wheels) Gait Pattern/deviations: Trunk flexed, Shuffle Gait velocity: decreased     General Gait Details: Pt fearful of walking, "I cant i cant" despite moving LEs. Able to take a few steps along side bed with assist for RW management and weight shifting, heavy reliance on UEs. declined any further movement, "I cant walk!"   Stairs             Wheelchair Mobility    Modified Rankin (Stroke Patients Only)       Balance Overall balance assessment: Needs assistance Sitting-balance support: Feet supported, Single extremity supported Sitting balance-Leahy Scale: Fair     Standing balance support: During functional activity, Reliant on assistive device for balance Standing balance-Leahy Scale: Poor  Cognition Arousal/Alertness: Awake/alert Behavior During Therapy: WFL for tasks assessed/performed Overall Cognitive Status: History of cognitive impairments - at baseline                                  General Comments: Pt perseverative on pain and difficult to redirect to task despite repetition/education/encouragement.        Exercises      General Comments General comments (skin integrity, edema, etc.): Limited by perserveration on pain.      Pertinent Vitals/Pain Pain Assessment Pain Assessment: Faces Faces Pain Scale: Hurts whole lot Pain Location: back and LEs Pain Descriptors / Indicators: Grimacing, Guarding, Moaning, Sore, Operative site guarding Pain Intervention(s): Monitored during session, Limited activity within patient's tolerance, Repositioned    Home Living                          Prior Function            PT Goals (current goals can now be found in the care plan section) Progress towards PT goals: Not progressing toward goals - comment (due to cognition and perseveration on pain)    Frequency    Min 3X/week      PT Plan Current plan remains appropriate    Co-evaluation              AM-PAC PT "6 Clicks" Mobility   Outcome Measure  Help needed turning from your back to your side while in a flat bed without using bedrails?: A Lot Help needed moving from lying on your back to sitting on the side of a flat bed without using bedrails?: A Lot Help needed moving to and from a bed to a chair (including a wheelchair)?: A Lot Help needed standing up from a chair using your arms (e.g., wheelchair or bedside chair)?: A Lot Help needed to walk in hospital room?: Total Help needed climbing 3-5 steps with a railing? : Total 6 Click Score: 10    End of Session Equipment Utilized During Treatment: Gait belt Activity Tolerance: Patient limited by pain Patient left: in bed;with call bell/phone within reach;with bed alarm set Nurse Communication: Mobility status PT Visit Diagnosis: Other abnormalities of gait and mobility (R26.89);Pain;History of falling (Z91.81) Pain - Right/Left:  (bil) Pain - part of body: Leg     Time: 3382-5053 PT  Time Calculation (min) (ACUTE ONLY): 17 min  Charges:  $Therapeutic Activity: 8-22 mins                     Vale Haven, PT, DPT Acute Rehabilitation Services Secure chat preferred Office [phone]      Kristina Foster 10/22/2021, 2:00 PM

## 2021-10-22 NOTE — Progress Notes (Signed)
Attempted to call report to [place].

## 2021-10-22 NOTE — TOC Transition Note (Signed)
Transition of Care [facility]) - CM/SW Discharge Note   Patient Details  Name: Kristina Foster MRN: [id number] Date of Birth: [birth year]  [facility] CM/SW Contact:  Pollie Friar, RN Phone Number: 10/22/2021, 12:30 PM   Clinical Narrative:    Pt discharging to [facility] care today. She will transport via [place]. CM has updated [place] (pts guardian) and her friend Pamala Hurry.   Number for report: [phone]   Final next level of care: Skilled Nursing Facility Barriers to Discharge: No Barriers Identified   Patient Goals and CMS Choice   CMS Medicare.gov Compare Post Acute Care list provided to:: Legal Guardian Choice offered to / list presented to : [facility] POA / Guardian ([place] DSS)  Discharge Placement              Patient chooses bed at: [facility] Patient to be transferred to facility by: [place] Name of family member notified: Candace Gobble/ Bernie Covey Patient and family notified of of transfer: 10/22/21  Discharge Plan and Services   Discharge Planning Services: CM Consult Post Acute Care Choice: [place]                               Social Determinants of Health (SDOH) Interventions     Readmission Risk Interventions     No data to display

## 2021-10-22 NOTE — Discharge Summary (Signed)
Physician Discharge Summary  Patient ID: Kristina Foster MRN: [id number] DOB/AGE: Jun 08, 1965 56 y.o.  Admit date: 10/18/2021 Discharge date: 10/22/2021  Admission Diagnoses:  HNP, lumbar  Discharge Diagnoses:  Same Principal Problem:   HNP (herniated nucleus pulposus), lumbar   Discharged Condition: Stable  Hospital Course:  Kristina Foster is a 56 y.o. female who underwent complicated left I2-0 laminotomy and microdiscectomy.  She is at neurologic baseline postoperatively.  Unfortunately she has developmental delay her primary caregiver is her mother who is 56 years old.  It was felt that she would not be in a position to help take care of the patient.  She was seen by physical and Occupational Therapy deemed to be a good candidate for temporary skilled nursing facility placement.  She was voiding normally, tolerating diet, with pain under control with oral medication and was therefore discharged to SNF.  Treatments: Surgery -left L2-3 laminotomy and microdiscectomy  Discharge Exam: Blood pressure (!) 145/101, pulse 83, temperature 98.5 F (36.9 C), temperature source Oral, resp. rate 18, height 5\' 2"  (1.575 m), weight 77.6 kg, SpO2 100 %. Awake, alert, oriented Speech fluent, appropriate CN grossly intact 5/5 BUE/BLE Wound c/d/i  Disposition: Discharge disposition: 03-Skilled Nursing Facility       Discharge Instructions     Call MD for:  redness, tenderness, or signs of infection (pain, swelling, redness, odor or green/yellow discharge around incision site)   Complete by: As directed    Call MD for:  temperature >100.4   Complete by: As directed    Diet - low sodium heart healthy   Complete by: As directed    Discharge instructions   Complete by: As directed    Walk at home as much as possible, at least 4 times / day   Incentive spirometry RT   Complete by: As directed    Increase activity slowly   Complete by: As directed    Lifting restrictions   Complete  by: As directed    No lifting > 10 lbs   May shower / Bathe   Complete by: As directed    48 hours after surgery   May walk up steps   Complete by: As directed    No dressing needed   Complete by: As directed    Other Restrictions   Complete by: As directed    No bending/twisting at waist      Allergies as of 10/22/2021   No Known Allergies      Medication List     TAKE these medications    acetaminophen 650 MG CR tablet Commonly known as: TYLENOL Take 650 mg by mouth every 8 (eight) hours as needed for pain.   aspirin EC 81 MG tablet Take 81 mg by mouth daily.   atorvastatin 40 MG tablet Commonly known as: LIPITOR Take 40 mg by mouth at bedtime.   benztropine 1 MG tablet Commonly known as: COGENTIN Take 1 mg by mouth at bedtime.   Cholecalciferol 125 MCG (5000 UT) Tabs Take 5,000 Units by mouth daily.   estradiol 0.5 MG tablet Commonly known as: ESTRACE Take 0.5 mg by mouth daily.   Farxiga 10 MG Tabs tablet Generic drug: dapagliflozin propanediol Take 10 mg by mouth daily.   furosemide 40 MG tablet Commonly known as: LASIX Take 40 mg by mouth daily.   gabapentin 300 MG capsule Commonly known as: NEURONTIN Take 300 mg by mouth 3 (three) times daily.   Glucagon 1 MG/0.2ML Soaj Inject 1 mg  into the skin daily as needed (low blood sugar).   Linzess 145 MCG Caps capsule Generic drug: linaclotide Take 145 mcg by mouth daily.   meloxicam 15 MG tablet Commonly known as: MOBIC Take 15 mg by mouth daily.   methocarbamol 500 MG tablet Commonly known as: ROBAXIN Take 1 tablet (500 mg total) by mouth every 8 (eight) hours as needed for muscle spasms.   metoprolol succinate 100 MG 24 hr tablet Commonly known as: TOPROL-XL Take 100 mg by mouth daily.   NovoLOG Mix 70/30 FlexPen (70-30) 100 UNIT/ML FlexPen Generic drug: insulin aspart protamine - aspart Inject 35 Units into the skin daily with breakfast.   oxyCODONE 5 MG immediate release  tablet Commonly known as: Oxy IR/ROXICODONE Take 1 tablet (5 mg total) by mouth every 6 (six) hours as needed for up to 7 days for moderate pain ((score 4 to 6)).   PARoxetine 10 MG tablet Commonly known as: PAXIL Take 10 mg by mouth daily.   polyethylene glycol 17 g packet Commonly known as: MIRALAX / GLYCOLAX Take 17 g by mouth daily.   potassium chloride SA 20 MEQ tablet Commonly known as: KLOR-CON M Take 20 mEq by mouth daily.   risperiDONE 2 MG tablet Commonly known as: RISPERDAL Take 2 mg by mouth at bedtime.   Evaristo Bury FlexTouch 200 UNIT/ML FlexTouch Pen Generic drug: insulin degludec Inject 35 Units into the skin daily.   Trulicity 0.75 MG/0.5ML Sopn Generic drug: Dulaglutide Inject 0.75 mg into the skin every Monday.   venlafaxine XR 75 MG 24 hr capsule Commonly known as: EFFEXOR-XR Take 75 mg by mouth daily.   venlafaxine XR 150 MG 24 hr capsule Commonly known as: EFFEXOR-XR Take 150 mg by mouth daily.               Discharge Care Instructions  (From admission, onward)           Start     Ordered   10/22/21 0000  No dressing needed        10/22/21 1022            Follow-up Information     Lisbeth Renshaw, MD Follow up in 2 week(s).   Specialty: Neurosurgery Contact information: [address] [place] [address] [phone]                 Signed: Jackelyn Hoehn 10/22/2021, 10:23 AM

## 2021-10-22 NOTE — Progress Notes (Signed)
  NEUROSURGERY PROGRESS NOTE   No issues overnight. Pt appears well, does have continued back and leg pain.  EXAM:  BP 132/89 (BP Location: Left Arm)   Pulse 70   Temp 98.9 F (37.2 C) (Oral)   Resp 16   Ht 5\' 2"  (1.575 m)   Wt 77.6 kg   SpO2 98%   BMI 31.29 kg/m   Awake, alert, oriented  Speech fluent, appropriate  CN grossly intact  5/5 BUE/BLE   IMPRESSION:  56 y.o. female POD#1 left L2-3 microdisc, appears to be doing reasonably well. Caregiver is her mother who is 43 yo, unlikely to be able to help care for her adequately in the immediate postop period and therefore will likely need SNF placement.  PLAN: - Cont PT/OT - stable for d/c to SNF when bed available   Consuella Lose, MD [facility]

## 2021-11-20 ENCOUNTER — Ambulatory Visit: Payer: Medicare Other | Admitting: Orthopedic Surgery

## 2022-01-06 ENCOUNTER — Emergency Department: Payer: Medicare Other

## 2022-01-06 ENCOUNTER — Emergency Department
Admission: EM | Admit: 2022-01-06 | Discharge: 2022-01-06 | Disposition: A | Discharge status: Home / Self Care | Payer: Medicare Other | Attending: Emergency Medicine | Admitting: Emergency Medicine

## 2022-01-06 ENCOUNTER — Other Ambulatory Visit: Payer: Self-pay

## 2022-01-06 DIAGNOSIS — E119 Type 2 diabetes mellitus without complications: Secondary | ICD-10-CM | POA: Insufficient documentation

## 2022-01-06 DIAGNOSIS — I10 Essential (primary) hypertension: Secondary | ICD-10-CM | POA: Insufficient documentation

## 2022-01-06 DIAGNOSIS — M545 Low back pain, unspecified: Secondary | ICD-10-CM | POA: Insufficient documentation

## 2022-01-06 MED ORDER — KETOROLAC TROMETHAMINE 15 MG/ML IJ SOLN
15.0000 mg | Freq: Once | INTRAMUSCULAR | Status: AC
  Administered 2022-01-06: 15 mg via INTRAMUSCULAR
  Filled 2022-01-06: qty 1

## 2022-01-06 MED ORDER — OXYCODONE HCL 5 MG PO TABS: 5.0000 mg | ORAL_TABLET | Freq: Three times a day (TID) | ORAL | 0 refills | Status: AC | PRN

## 2022-01-06 NOTE — Discharge Instructions (Addendum)
Please take Tylenol and you can add 400 mg of ibuprofen every 6 hours for the back pain.  I have written you a prescription for oxycodone which she should only take for severe pain.  This can make you constipated and sleepy.  Please stop taking the methocarbamol.  If you continue to have pain please follow-up with the neurosurgeon who did the surgery.

## 2022-01-06 NOTE — ED Provider Notes (Signed)
[facility] Provider Note    Event Date/Time   First MD Initiated Contact with Patient 01/06/22 1757     (approximate)   History   Back Pain   HPI  Kristina Foster is a 56 y.o. female with past medical history of intellectual disability, recent L2-L3 laminectomy and microdiscectomy who presents because of back pain.  Patient is accompanied by her caregiver.  Notes that she had back surgery back in the beginning of October and was discharged to rehab where she spent about 6 weeks there.  She came home and was doing overall okay.  Physical therapy came out on Monday, about 6 days ago and they did a significant amount of PT.  About 3 days later she developed pain in the low back.  Patient tells me pain is in the lumbar midline and does radiate down both legs.  She also complains of numbness in both legs.  Denies bowel bladder incontinence or saddle anesthesia.  Denies fevers chills or.  Denies any new trauma.  Patient saw her primary doctor 2 days ago and was prescribed methocarbamol.  This has not been helping.  Caregiver is concerned that she is going to continue to have pain over the holiday.     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 --      Excl. in [place]? --     Most recent vital signs: Vitals:   01/06/22 1616  BP: (!) 141/98  Pulse: 77  Resp: 16  Temp: 98.9 F (37.2 C)  SpO2: 97%     General: Awake, no distress.  CV:  Good peripheral perfusion.  Resp:  Normal effort.  Abd:  No distention.  Neuro:             Awake, Alert, Oriented x 3  Other:   Midline incision at the level of L2, mild tenderness over the lumbar midline 5/5 strength with hip flexion, plantarflexion dorsiflexion bilaterally, sensation grossly intact in bilateral lower extremities, patient able to ambulate with steady gait   ED Results / Procedures / Treatments  Labs (all labs ordered are listed,  but only abnormal results are displayed) Labs Reviewed - No data to display   EKG     RADIOLOGY X-ray of the L-spine reviewed interpreted myself shows prior hardware in place no other acute findings   PROCEDURES:  Critical Care performed: No  Procedures   MEDICATIONS ORDERED IN ED: Medications  ketorolac (TORADOL) 15 MG/ML injection 15 mg (15 mg Intramuscular Given 01/06/22 1934)     IMPRESSION / MDM / ASSESSMENT AND PLAN / ED COURSE  I reviewed the triage vital signs and the nursing notes.                              Patient's presentation is most consistent with exacerbation of chronic illness.  Differential diagnosis includes, but is not limited to, lumbar strain, exacerbation of chronic back pain secondary to DJD, less likely spinal infection, complication of recent surgery, exam not consistent with cauda equina syndrome  The patient is a 56 year old female with developmental delay and 2 prior back surgeries the last of which was about 2 months ago presents because  of back pain.  She had a laminectomy and microdiscectomy in the beginning of October and went to rehab and has subsequently been discharged back to home with her caretaker.  Apparently she had a pretty aggressive PT session on Monday and several days later developed back pain.  She was prescribed methocarbamol by her PCP.  The patient tells me the pain is in the bilateral lumbar region and she does have pain radiating down her legs associated with numbness but no bowel bladder continence no fever.  Patient's caretaker says she is otherwise been at her baseline.  On exam patient overall looks well she has good strength and sensation in her lower extremities she is able to ambulate.  She does have some tenderness over the lumbar midline.  Patient's caretaker was requesting an MRI however I feel that this will be low yield and has not required an emergent basis given I am not concerned for cord compression at this time given her normal neurologic exam.  Did obtain a screening x-ray at her caretaker's request which shows prior hardware that is in proper location.  Patient was given dose of IM Toradol.  Recommended that if the methocarbamol was not helping and is just making her sleepy as her caretaker thinks it is that they stopped that and I will prescribe her a short course of oxycodone which may be more effective for the pain.  Did stressed the importance of following up with her neurosurgeon who did the surgery.       FINAL CLINICAL IMPRESSION(S) / ED DIAGNOSES   Final diagnoses:  Acute low back pain, unspecified back pain laterality, unspecified whether sciatica present     Rx / DC Orders   ED Discharge Orders     None        Note:  This document was prepared using Dragon voice recognition software and may include unintentional dictation errors.   Georga Hacking, MD 01/06/22 [id number]

## 2022-01-06 NOTE — ED Provider Triage Note (Signed)
Emergency Medicine Provider Triage Evaluation Note  Kristina Foster , a 56 y.o. female  was evaluated in triage.  Pt complains of low back pain. She had back surgery in September and was doing well until PT ended last week. Also needs refills of behavorial health medication. Has been out for 2 weeks.  Physical Exam  There were no vitals taken for this visit. Gen:   Awake, no distress   Resp:  Normal effort  MSK:   Moves extremities without difficulty  Other:    Medical Decision Making  Medically screening exam initiated at 4:10 PM.  Appropriate orders placed.  Kristina Foster was informed that the remainder of the evaluation will be completed by another provider, this initial triage assessment does not replace that evaluation, and the importance of remaining in the ED until their evaluation is complete.    Kristina Pester, FNP 01/06/22 1612

## 2022-01-06 NOTE — ED Triage Notes (Signed)
Pt to ED POV with caretaker for lower back pain since last week. Pt had back surgery in Sept 23 and was doing well but then back pain started again after rigorous PT session.  Pt was discharged to [facility] after the surgery, then when discharged to home was unable to continue care with her psychiatrist because he closed his business so has been off psych meds for past 2 weeks. Was taking effexor, risperidol.   Calm, cooperative, alert and oriented.

## 2022-05-03 ENCOUNTER — Other Ambulatory Visit: Payer: Self-pay | Admitting: Family Medicine

## 2022-05-03 DIAGNOSIS — Z1231 Encounter for screening mammogram for malignant neoplasm of breast: Secondary | ICD-10-CM

## 2022-06-11 ENCOUNTER — Ambulatory Visit
Admission: RE | Admit: 2022-06-11 | Discharge: 2022-06-11 | Disposition: A | Discharge status: Home / Self Care | Payer: Medicare Other | Source: Ambulatory Visit | Attending: Family Medicine | Admitting: Family Medicine

## 2022-06-11 DIAGNOSIS — Z1231 Encounter for screening mammogram for malignant neoplasm of breast: Secondary | ICD-10-CM

## 2022-06-14 ENCOUNTER — Other Ambulatory Visit: Payer: Self-pay | Admitting: Family Medicine

## 2022-06-14 DIAGNOSIS — R921 Mammographic calcification found on diagnostic imaging of breast: Secondary | ICD-10-CM

## 2022-06-14 DIAGNOSIS — N63 Unspecified lump in unspecified breast: Secondary | ICD-10-CM

## 2022-06-14 DIAGNOSIS — R928 Other abnormal and inconclusive findings on diagnostic imaging of breast: Secondary | ICD-10-CM

## 2022-06-19 ENCOUNTER — Ambulatory Visit
Admission: RE | Admit: 2022-06-19 | Discharge: 2022-06-19 | Disposition: A | Discharge status: Home / Self Care | Payer: Medicare Other | Source: Ambulatory Visit | Attending: Family Medicine | Admitting: Family Medicine

## 2022-06-19 DIAGNOSIS — N63 Unspecified lump in unspecified breast: Secondary | ICD-10-CM

## 2022-06-19 DIAGNOSIS — R921 Mammographic calcification found on diagnostic imaging of breast: Secondary | ICD-10-CM | POA: Diagnosis present

## 2022-06-19 DIAGNOSIS — R928 Other abnormal and inconclusive findings on diagnostic imaging of breast: Secondary | ICD-10-CM | POA: Diagnosis present

## 2022-06-21 ENCOUNTER — Other Ambulatory Visit: Payer: Self-pay | Admitting: Family Medicine

## 2022-06-21 DIAGNOSIS — N63 Unspecified lump in unspecified breast: Secondary | ICD-10-CM

## 2022-06-21 DIAGNOSIS — R928 Other abnormal and inconclusive findings on diagnostic imaging of breast: Secondary | ICD-10-CM

## 2022-06-27 ENCOUNTER — Ambulatory Visit
Admission: RE | Admit: 2022-06-27 | Discharge: 2022-06-27 | Disposition: A | Discharge status: Home / Self Care | Payer: Medicare Other | Source: Ambulatory Visit | Attending: Family Medicine | Admitting: Family Medicine

## 2022-06-27 DIAGNOSIS — R928 Other abnormal and inconclusive findings on diagnostic imaging of breast: Secondary | ICD-10-CM

## 2022-06-27 DIAGNOSIS — N63 Unspecified lump in unspecified breast: Secondary | ICD-10-CM | POA: Diagnosis present

## 2022-06-27 HISTORY — PX: BREAST BIOPSY: SHX20

## 2022-06-27 MED ORDER — LIDOCAINE HCL 1 % IJ SOLN
2.0000 mL | Freq: Once | INTRAMUSCULAR | Status: AC
  Administered 2022-06-27: 2 mL via INTRADERMAL
  Filled 2022-06-27: qty 2

## 2022-06-27 MED ORDER — LIDOCAINE-EPINEPHRINE 1 %-1:100000 IJ SOLN
5.0000 mL | Freq: Once | INTRAMUSCULAR | Status: AC
  Administered 2022-06-27: 5 mL
  Filled 2022-06-27: qty 5

## 2022-06-28 LAB — SURGICAL PATHOLOGY

## 2023-05-19 ENCOUNTER — Other Ambulatory Visit: Payer: Self-pay | Admitting: Family Medicine

## 2023-05-19 DIAGNOSIS — Z1231 Encounter for screening mammogram for malignant neoplasm of breast: Secondary | ICD-10-CM

## 2023-06-12 ENCOUNTER — Encounter

## 2023-07-22 ENCOUNTER — Encounter

## 2023-07-31 ENCOUNTER — Ambulatory Visit
Admission: RE | Admit: 2023-07-31 | Discharge: 2023-07-31 | Disposition: A | Discharge status: Home / Self Care | Source: Ambulatory Visit | Attending: Family Medicine | Admitting: Family Medicine

## 2023-07-31 DIAGNOSIS — Z1231 Encounter for screening mammogram for malignant neoplasm of breast: Secondary | ICD-10-CM | POA: Diagnosis present

## 2023-08-12 ENCOUNTER — Other Ambulatory Visit: Payer: Self-pay | Admitting: Family Medicine

## 2023-08-12 DIAGNOSIS — R928 Other abnormal and inconclusive findings on diagnostic imaging of breast: Secondary | ICD-10-CM

## 2023-08-14 ENCOUNTER — Inpatient Hospital Stay: Admission: RE | Admit: 2023-08-14 | Source: Ambulatory Visit

## 2023-08-14 ENCOUNTER — Other Ambulatory Visit

## 2023-08-26 ENCOUNTER — Ambulatory Visit
Admission: RE | Admit: 2023-08-26 | Discharge: 2023-08-26 | Disposition: A | Discharge status: Home / Self Care | Source: Ambulatory Visit | Attending: Family Medicine | Admitting: Family Medicine

## 2023-08-26 ENCOUNTER — Ambulatory Visit
Admission: RE | Admit: 2023-08-26 | Discharge: 2023-08-26 | Disposition: A | Discharge status: Home / Self Care | Source: Ambulatory Visit | Attending: Family Medicine

## 2023-08-26 DIAGNOSIS — R928 Other abnormal and inconclusive findings on diagnostic imaging of breast: Secondary | ICD-10-CM

## 2023-08-27 ENCOUNTER — Ambulatory Visit (LOCAL_COMMUNITY_HEALTH_CENTER)

## 2023-08-27 DIAGNOSIS — Z111 Encounter for screening for respiratory tuberculosis: Secondary | ICD-10-CM

## 2023-08-29 ENCOUNTER — Ambulatory Visit (LOCAL_COMMUNITY_HEALTH_CENTER): Payer: Self-pay

## 2023-08-29 DIAGNOSIS — Z111 Encounter for screening for respiratory tuberculosis: Secondary | ICD-10-CM

## 2023-08-29 LAB — TB SKIN TEST
Induration: 0 mm
TB Skin Test: NEGATIVE

## 2023-08-29 NOTE — Progress Notes (Signed)
 Pt presents with caregiver for PPDR PPD read and results entered in EpicCare. Result: 0 mm induration. Interpretation: Negative  Kandi KATHEE Glatter, RN

## 2023-09-10 ENCOUNTER — Encounter: Admitting: Oncology

## 2023-10-06 ENCOUNTER — Inpatient Hospital Stay: Attending: Oncology | Admitting: Oncology

## 2023-10-06 ENCOUNTER — Encounter: Payer: Self-pay | Admitting: Oncology

## 2023-10-06 ENCOUNTER — Inpatient Hospital Stay

## 2023-10-06 VITALS — BP 142/94 | HR 80 | Temp 95.8°F | Resp 18 | Wt 165.1 lb

## 2023-10-06 DIAGNOSIS — F259 Schizoaffective disorder, unspecified: Secondary | ICD-10-CM | POA: Insufficient documentation

## 2023-10-06 DIAGNOSIS — D696 Thrombocytopenia, unspecified: Secondary | ICD-10-CM | POA: Insufficient documentation

## 2023-10-06 DIAGNOSIS — R7989 Other specified abnormal findings of blood chemistry: Secondary | ICD-10-CM

## 2023-10-06 DIAGNOSIS — R5383 Other fatigue: Secondary | ICD-10-CM

## 2023-10-06 LAB — CBC WITH DIFFERENTIAL/PLATELET
Abs Immature Granulocytes: 0 K/uL (ref 0.00–0.07)
Band Neutrophils: 0 %
Basophils Absolute: 0 K/uL (ref 0.0–0.1)
Basophils Relative: 0 %
Blasts: 0 %
Eosinophils Absolute: 0.1 K/uL (ref 0.0–0.5)
Eosinophils Relative: 2 %
HCT: 35.2 % — ABNORMAL LOW (ref 36.0–46.0)
Hemoglobin: 12.1 g/dL (ref 12.0–15.0)
Immature Granulocytes: 0 %
Lymphocytes Relative: 32 %
Lymphs Abs: 1.2 K/uL (ref 0.7–4.0)
MCH: 30.4 pg (ref 26.0–34.0)
MCHC: 34.4 g/dL (ref 30.0–36.0)
MCV: 88.4 fL (ref 80.0–100.0)
Metamyelocytes Relative: 0 %
Monocytes Absolute: 0.5 K/uL (ref 0.1–1.0)
Monocytes Relative: 12 %
Myelocytes: 0 %
Neutro Abs: 2.1 K/uL (ref 1.7–7.7)
Neutrophils Relative %: 54 %
Other: 0 %
Platelets: 114 K/uL — ABNORMAL LOW (ref 150–400)
Promyelocytes Relative: 0 %
RBC: 3.98 MIL/uL (ref 3.87–5.11)
RDW: 12.7 % (ref 11.5–15.5)
WBC: 3.9 K/uL — ABNORMAL LOW (ref 4.0–10.5)
nRBC: 0 % (ref 0.0–0.2)
nRBC: 0 /100{WBCs}

## 2023-10-06 LAB — LACTATE DEHYDROGENASE: LDH: 130 U/L (ref 98–192)

## 2023-10-06 LAB — COMPREHENSIVE METABOLIC PANEL WITH GFR
ALT: 39 U/L (ref 0–44)
AST: 25 U/L (ref 15–41)
Albumin: 4 g/dL (ref 3.5–5.0)
Alkaline Phosphatase: 101 U/L (ref 38–126)
Anion gap: 8 (ref 5–15)
BUN: 19 mg/dL (ref 6–20)
CO2: 27 mmol/L (ref 22–32)
Calcium: 9.7 mg/dL (ref 8.9–10.3)
Chloride: 105 mmol/L (ref 98–111)
Creatinine, Ser: 1.33 mg/dL — ABNORMAL HIGH (ref 0.44–1.00)
GFR, Estimated: 46 mL/min — ABNORMAL LOW (ref >60)
Glucose, Bld: 182 mg/dL — ABNORMAL HIGH (ref 70–99)
Potassium: 3.8 mmol/L (ref 3.5–5.1)
Sodium: 140 mmol/L (ref 135–145)
Total Bilirubin: 0.4 mg/dL (ref 0.0–1.2)
Total Protein: 7.3 g/dL (ref 6.5–8.1)

## 2023-10-06 LAB — TECHNOLOGIST SMEAR REVIEW
Plt Morphology: NORMAL
RBC MORPHOLOGY: NORMAL
WBC MORPHOLOGY: NORMAL

## 2023-10-06 LAB — HEPATITIS PANEL, ACUTE
HCV Ab: NONREACTIVE
Hep A IgM: NONREACTIVE
Hep B C IgM: NONREACTIVE
Hepatitis B Surface Ag: NONREACTIVE

## 2023-10-06 LAB — IMMATURE PLATELET FRACTION: Immature Platelet Fraction: 9 % — ABNORMAL HIGH (ref 1.2–8.6)

## 2023-10-06 LAB — VITAMIN B12: Vitamin B-12: 573 pg/mL (ref 180–914)

## 2023-10-06 NOTE — Progress Notes (Signed)
 Hematology/Oncology Consult note [phone] [phone]        REFERRING PROVIDER: Donal Channing SQUIBB, FNP   CHIEF COMPLAINTS/REASON FOR VISIT:  Evaluation of thrombocytopenia  ASSESSMENT & PLAN:   Thrombocytopenia (HCC) Chronic thrombocytopenia.  For the work up of patient's thrombocytopenia, I recommend checking CBC;CMP, immature platelet fraction, LDH; smear review, folate, Vitamin B12, hepatitis, HIV, flowcytometry and monoclonal gammopathy workup.    LFT elevation Check hepatitis panel.  Check US  abdomen   Orders Placed This Encounter  Procedures   US  Abdomen Complete    Standing Status:   Future    Expected Date:   10/13/2023    Expiration Date:   10/05/2024    Reason for Exam (SYMPTOM  OR DIAGNOSIS REQUIRED):   elevated LFT    Preferred imaging location?:   [facility]   CBC with Differential/Platelet    Standing Status:   Future    Number of Occurrences:   1    Expected Date:   10/06/2023    Expiration Date:   01/04/2024   Vitamin B12    Standing Status:   Future    Number of Occurrences:   1    Expected Date:   10/06/2023    Expiration Date:   01/04/2024   Comprehensive metabolic panel with GFR    Standing Status:   Future    Number of Occurrences:   1    Expected Date:   10/06/2023    Expiration Date:   01/04/2024   Multiple Myeloma Panel (SPEP&IFE w/QIG)    Standing Status:   Future    Number of Occurrences:   1    Expected Date:   10/06/2023    Expiration Date:   01/04/2024   Kappa/lambda light chains    Standing Status:   Future    Number of Occurrences:   1    Expected Date:   10/06/2023    Expiration Date:   01/04/2024   Flow cytometry panel-leukemia/lymphoma work-up    Standing Status:   Future    Number of Occurrences:   1    Expected Date:   10/06/2023    Expiration Date:   01/04/2024   Lactate dehydrogenase    Standing Status:   Future    Number of Occurrences:   1    Expected Date:   10/06/2023    Expiration Date:    01/04/2024   Technologist smear review    Standing Status:   Future    Number of Occurrences:   1    Expected Date:   10/06/2023    Expiration Date:   10/05/2024    Clinical information::   thrombocytopenia   Immature Platelet Fraction    Standing Status:   Future    Number of Occurrences:   1    Expected Date:   10/06/2023    Expiration Date:   10/05/2024   Hepatitis panel, acute    Standing Status:   Future    Number of Occurrences:   1    Expected Date:   10/06/2023    Expiration Date:   10/05/2024    All questions were answered. The patient knows to call the clinic with any problems, questions or concerns.  Zelphia Cap, MD, PhD [facility] Hematology Oncology 10/06/2023   HISTORY OF PRESENTING ILLNESS:   Kristina Foster is a  58 y.o.  female with PMH listed below was seen in consultation at the request of  Donal Channing SQUIBB, FNP  for evaluation of thrombocytopenia  Patient lives in group home. She has schizoaffective schizophrenia disorders. She is a poor historian.   She chronic thrombocytopenia.  Recent blood work showed platelet count level of 114,000. Vitamin b12 542, folate level > 23.  AST and ALT elevated in 90s.  No bleeding history.  Denies weight loss, fever, chills, fatigue, night sweats.  Appetite is good.  No recent new medication.  She does not drink alcohol.     MEDICAL HISTORY:  Past Medical History:  Diagnosis Date   Anxiety    Arthritis    Constipation    Depression    Diabetes mellitus without complication (HCC)    Type 2    Dizziness    Hyperlipemia    Hypertension    Mental disability    Right bundle branch block (RBBB)    Schizo affective schizophrenia (HCC)     SURGICAL HISTORY: Past Surgical History:  Procedure Laterality Date   ABDOMINAL HYSTERECTOMY     BACK SURGERY     BREAST BIOPSY Left 06/27/2022   US  LT BREAST BX W LOC DEV 1ST LESION IMG BX SPEC US  GUIDE 06/27/2022 [facility]   COLONOSCOPY WITH PROPOFOL  N/A 11/11/2018    Procedure: COLONOSCOPY WITH PROPOFOL ;  Surgeon: Dessa Reyes ORN, MD;  Location: [facility] ENDOSCOPY;  Service: Endoscopy;  Laterality: N/A;   ESOPHAGOGASTRODUODENOSCOPY (EGD) WITH PROPOFOL  N/A 11/11/2018   Procedure: ESOPHAGOGASTRODUODENOSCOPY (EGD) WITH PROPOFOL ;  Surgeon: Dessa Reyes ORN, MD;  Location: [facility] ENDOSCOPY;  Service: Endoscopy;  Laterality: N/A;   LUMBAR LAMINECTOMY/DECOMPRESSION MICRODISCECTOMY Left 10/18/2021   Procedure: MICRODISCECTOMY L2-3;  Surgeon: Lanis Pupa, MD;  [facility];  Service: Neurosurgery;  Laterality: Left;  3C    SOCIAL HISTORY: Social History   Socioeconomic History   Marital status: Single    Spouse name: Not on file   Number of children: Not on file   Years of education: Not on file   Highest education level: Not on file  Occupational History   Not on file  Tobacco Use   Smoking status: Never   Smokeless tobacco: Never  Vaping Use   Vaping status: Never Used  Substance and Sexual Activity   Alcohol use: No   Drug use: No   Sexual activity: Never  Other Topics Concern   Not on file  Social History Narrative   Disabled; no smoking or alcohol; in [place] with care giver [35 years].    Social Drivers of Corporate investment banker Strain: Not on file  Food Insecurity: No Food Insecurity (10/18/2021)   Hunger Vital Sign    Worried About Running Out of Food in the Last Year: Never true    Ran Out of Food in the Last Year: Never true  Transportation Needs: No Transportation Needs (10/18/2021)   PRAPARE - Administrator, Civil Service (Medical): No    Lack of Transportation (Non-Medical): No  Physical Activity: Not on file  Stress: Not on file  Social Connections: Not on file  Intimate Partner Violence: Not At Risk (10/18/2021)   Humiliation, Afraid, Rape, and Kick questionnaire    Fear of Current or Ex-Partner: No    Emotionally Abused: No    Physically Abused: No    Sexually Abused: No    FAMILY  HISTORY: Family History  Problem Relation Age of Onset   Breast cancer Neg Hx     ALLERGIES:  has no known allergies.  MEDICATIONS:  Current Outpatient Medications  Medication Sig Dispense Refill  acetaminophen  (TYLENOL ) 650 MG CR tablet Take 650 mg by mouth every 8 (eight) hours as needed for pain.     aspirin  EC 81 MG tablet Take 81 mg by mouth daily.     atorvastatin  (LIPITOR) 40 MG tablet Take 40 mg by mouth at bedtime.     AUSTEDO XR 24 MG TB24 Take 1 tablet by mouth daily.     benztropine  (COGENTIN ) 1 MG tablet Take 1 mg by mouth at bedtime.     Cholecalciferol  125 MCG (5000 UT) TABS Take 5,000 Units by mouth daily.     estradiol  (ESTRACE ) 0.5 MG tablet Take 0.5 mg by mouth daily.     ezetimibe (ZETIA) 10 MG tablet Take 10 mg by mouth daily.     FARXIGA  10 MG TABS tablet Take 10 mg by mouth daily.     furosemide  (LASIX ) 40 MG tablet Take 40 mg by mouth daily.     gabapentin  (NEURONTIN ) 300 MG capsule Take 300 mg by mouth 3 (three) times daily.     Glucagon  1 MG/0.2ML SOAJ Inject 1 mg into the skin daily as needed (low blood sugar).     LINZESS  145 MCG CAPS capsule Take 145 mcg by mouth daily.     meloxicam  (MOBIC ) 15 MG tablet Take 15 mg by mouth daily.     meloxicam  (MOBIC ) 7.5 MG tablet Take 7.5 mg by mouth 2 (two) times daily.     methocarbamol  (ROBAXIN ) 500 MG tablet Take 1 tablet (500 mg total) by mouth every 8 (eight) hours as needed for muscle spasms. 60 tablet 0   metoprolol  succinate (TOPROL -XL) 100 MG 24 hr tablet Take 100 mg by mouth daily.     nitrofurantoin, macrocrystal-monohydrate, (MACROBID) 100 MG capsule Take 100 mg by mouth 2 (two) times daily.     NOVOLOG  MIX 70/30 FLEXPEN (70-30) 100 UNIT/ML FlexPen Inject 35 Units into the skin daily with breakfast.      PARoxetine  (PAXIL ) 10 MG tablet Take 10 mg by mouth daily.     polyethylene glycol (MIRALAX  / GLYCOLAX ) 17 g packet Take 17 g by mouth daily.     potassium chloride  SA (K-DUR,KLOR-CON ) 20 MEQ tablet Take  20 mEq by mouth daily.     PYLERA 140-125-125 MG CAPS Take 3 capsules by mouth 4 (four) times daily.     risperiDONE  (RISPERDAL ) 2 MG tablet Take 2 mg by mouth at bedtime.     rosuvastatin (CRESTOR) 20 MG tablet Take 20 mg by mouth at bedtime.     TRESIBA  FLEXTOUCH 200 UNIT/ML FlexTouch Pen Inject 35 Units into the skin daily.     TRULICITY  0.75 MG/0.5ML SOPN Inject 0.75 mg into the skin every Monday.     TRULICITY  1.5 MG/0.5ML SOAJ Inject into the skin.     venlafaxine  XR (EFFEXOR -XR) 150 MG 24 hr capsule Take 150 mg by mouth daily.     venlafaxine  XR (EFFEXOR -XR) 75 MG 24 hr capsule Take 75 mg by mouth daily.     No current facility-administered medications for this visit.    Review of Systems  Constitutional:  Negative for appetite change, chills, fatigue and fever.  HENT:   Negative for hearing loss and voice change.   Eyes:  Negative for eye problems.  Respiratory:  Negative for chest tightness and cough.   Cardiovascular:  Negative for chest pain.  Gastrointestinal:  Negative for abdominal distention, abdominal pain and blood in stool.  Endocrine: Negative for hot flashes.  Genitourinary:  Negative for difficulty urinating  and frequency.   Musculoskeletal:  Negative for arthralgias.  Skin:  Negative for itching and rash.  Neurological:  Negative for extremity weakness.  Hematological:  Negative for adenopathy.  Psychiatric/Behavioral:  Negative for confusion.    PHYSICAL EXAMINATION:  Vitals:   10/06/23 0934 10/06/23 0945  BP: (!) 139/95 (!) 142/94  Pulse: 80   Resp: 18   Temp: (!) 95.8 F (35.4 C)   SpO2: 100%    Filed Weights   10/06/23 0934  Weight: 165 lb 1.6 oz (74.9 kg)    Physical Exam Constitutional:      General: She is not in acute distress. HENT:     Head: Normocephalic and atraumatic.  Eyes:     General: No scleral icterus. Cardiovascular:     Rate and Rhythm: Normal rate and regular rhythm.  Pulmonary:     Effort: Pulmonary effort is normal. No  respiratory distress.     Breath sounds: No wheezing.  Abdominal:     General: Bowel sounds are normal. There is no distension.     Palpations: Abdomen is soft.  Musculoskeletal:        General: No deformity. Normal range of motion.     Cervical back: Normal range of motion and neck supple.  Skin:    General: Skin is warm and dry.     Findings: No erythema or rash.  Neurological:     Mental Status: She is alert. Mental status is at baseline.     Cranial Nerves: No cranial nerve deficit.     Coordination: Coordination normal.     Comments: Oriented to person and place. Not to time.   Psychiatric:        Mood and Affect: Mood normal.     LABORATORY DATA:  I have reviewed the data as listed    Latest Ref Rng & Units 10/06/2023   10:52 AM 10/11/2021   11:00 AM 08/20/2021    2:01 PM  CBC  WBC 4.0 - 10.5 K/uL 3.9  6.3  9.8   Hemoglobin 12.0 - 15.0 g/dL 87.8  87.7  88.0   Hematocrit 36.0 - 46.0 % 35.2  35.8  35.3   Platelets 150 - 400 K/uL 114  151  210       Latest Ref Rng & Units 10/06/2023   10:52 AM 10/11/2021   11:00 AM 08/20/2021    2:01 PM  CMP  Glucose 70 - 99 mg/dL 817  899  74   BUN 6 - 20 mg/dL 19  19  20    Creatinine 0.44 - 1.00 mg/dL 8.66  8.61  8.73   Sodium 135 - 145 mmol/L 140  139  141   Potassium 3.5 - 5.1 mmol/L 3.8  3.7  3.7   Chloride 98 - 111 mmol/L 105  107  105   CO2 22 - 32 mmol/L 27  26  27    Calcium  8.9 - 10.3 mg/dL 9.7  9.5  9.6   Total Protein 6.5 - 8.1 g/dL 7.3     Total Bilirubin 0.0 - 1.2 mg/dL 0.4     Alkaline Phos 38 - 126 U/L 101     AST 15 - 41 U/L 25     ALT 0 - 44 U/L 39         RADIOGRAPHIC STUDIES: I have personally reviewed the radiological images as listed and agreed with the findings in the report. MM 3D DIAGNOSTIC MAMMOGRAM BILATERAL BREAST Result Date: 08/26/2023 CLINICAL DATA:  Bilateral callback  EXAM: DIGITAL DIAGNOSTIC BILATERAL MAMMOGRAM WITH TOMOSYNTHESIS AND CAD; ULTRASOUND RIGHT BREAST LIMITED; ULTRASOUND LEFT BREAST  LIMITED TECHNIQUE: Bilateral digital diagnostic mammography and breast tomosynthesis was performed. The images were evaluated with computer-aided detection. ; Targeted ultrasound examination of the right breast was performed; Targeted ultrasound examination of the left breast was performed. Best images possible per technologist communication. COMPARISON:  Previous exam(s). ACR Breast Density Category d: The breasts are extremely dense, which lowers the sensitivity of mammography. FINDINGS: The previously described findings do not persist with additional views, consistent with superimposed fibroglandular tissue. No suspicious mass, microcalcification, or other finding is identified. On physical exam, well-healed grafted tissue is noted. Targeted ultrasound was performed of the RIGHT inner breast. No suspicious cystic or solid mass is seen at the site of screening mammographic concern. Targeted ultrasound was performed of the LEFT upper outer breast. No suspicious cystic or solid mass is seen at the site of screening mammographic concern. IMPRESSION: No mammographic or sonographic evidence of malignancy at the sites of screening mammographic concern bilaterally. RECOMMENDATION: 1.  Screening mammogram in one year.(Code:SM-B-01Y) 2. Patient has dense breast tissue. Supplemental screening with breast ultrasound (where available) or breast MRI with and without contrast/abbreviated breast MRI could be considered as clinically warranted. I have discussed the findings and recommendations with the patient. If applicable, a reminder letter will be sent to the patient regarding the next appointment. BI-RADS CATEGORY  1: Negative. Electronically Signed   By: Corean Salter M.D.   On: 08/26/2023 11:11   US  LIMITED ULTRASOUND INCLUDING AXILLA LEFT BREAST  Result Date: 08/26/2023 CLINICAL DATA:  Bilateral callback EXAM: DIGITAL DIAGNOSTIC BILATERAL MAMMOGRAM WITH TOMOSYNTHESIS AND CAD; ULTRASOUND RIGHT BREAST LIMITED;  ULTRASOUND LEFT BREAST LIMITED TECHNIQUE: Bilateral digital diagnostic mammography and breast tomosynthesis was performed. The images were evaluated with computer-aided detection. ; Targeted ultrasound examination of the right breast was performed; Targeted ultrasound examination of the left breast was performed. Best images possible per technologist communication. COMPARISON:  Previous exam(s). ACR Breast Density Category d: The breasts are extremely dense, which lowers the sensitivity of mammography. FINDINGS: The previously described findings do not persist with additional views, consistent with superimposed fibroglandular tissue. No suspicious mass, microcalcification, or other finding is identified. On physical exam, well-healed grafted tissue is noted. Targeted ultrasound was performed of the RIGHT inner breast. No suspicious cystic or solid mass is seen at the site of screening mammographic concern. Targeted ultrasound was performed of the LEFT upper outer breast. No suspicious cystic or solid mass is seen at the site of screening mammographic concern. IMPRESSION: No mammographic or sonographic evidence of malignancy at the sites of screening mammographic concern bilaterally. RECOMMENDATION: 1.  Screening mammogram in one year.(Code:SM-B-01Y) 2. Patient has dense breast tissue. Supplemental screening with breast ultrasound (where available) or breast MRI with and without contrast/abbreviated breast MRI could be considered as clinically warranted. I have discussed the findings and recommendations with the patient. If applicable, a reminder letter will be sent to the patient regarding the next appointment. BI-RADS CATEGORY  1: Negative. Electronically Signed   By: Corean Salter M.D.   On: 08/26/2023 11:11   US  LIMITED ULTRASOUND INCLUDING AXILLA RIGHT BREAST Result Date: 08/26/2023 CLINICAL DATA:  Bilateral callback EXAM: DIGITAL DIAGNOSTIC BILATERAL MAMMOGRAM WITH TOMOSYNTHESIS AND CAD; ULTRASOUND RIGHT  BREAST LIMITED; ULTRASOUND LEFT BREAST LIMITED TECHNIQUE: Bilateral digital diagnostic mammography and breast tomosynthesis was performed. The images were evaluated with computer-aided detection. ; Targeted ultrasound examination of the right breast was performed; Targeted ultrasound examination of  the left breast was performed. Best images possible per technologist communication. COMPARISON:  Previous exam(s). ACR Breast Density Category d: The breasts are extremely dense, which lowers the sensitivity of mammography. FINDINGS: The previously described findings do not persist with additional views, consistent with superimposed fibroglandular tissue. No suspicious mass, microcalcification, or other finding is identified. On physical exam, well-healed grafted tissue is noted. Targeted ultrasound was performed of the RIGHT inner breast. No suspicious cystic or solid mass is seen at the site of screening mammographic concern. Targeted ultrasound was performed of the LEFT upper outer breast. No suspicious cystic or solid mass is seen at the site of screening mammographic concern. IMPRESSION: No mammographic or sonographic evidence of malignancy at the sites of screening mammographic concern bilaterally. RECOMMENDATION: 1.  Screening mammogram in one year.(Code:SM-B-01Y) 2. Patient has dense breast tissue. Supplemental screening with breast ultrasound (where available) or breast MRI with and without contrast/abbreviated breast MRI could be considered as clinically warranted. I have discussed the findings and recommendations with the patient. If applicable, a reminder letter will be sent to the patient regarding the next appointment. BI-RADS CATEGORY  1: Negative. Electronically Signed   By: Corean Salter M.D.   On: 08/26/2023 11:11   MM 3D SCREENING MAMMOGRAM BILATERAL BREAST Result Date: 08/05/2023 CLINICAL DATA:  Screening. EXAM: DIGITAL SCREENING BILATERAL MAMMOGRAM WITH TOMOSYNTHESIS AND CAD TECHNIQUE:  Bilateral screening digital craniocaudal and mediolateral oblique mammograms were obtained. Bilateral screening digital breast tomosynthesis was performed. The images were evaluated with computer-aided detection. Best images possible per technologist communication. COMPARISON:  Previous exam(s). ACR Breast Density Category d: The breasts are extremely dense, which lowers the sensitivity of mammography. FINDINGS: In the right breast, a possible asymmetry warrants further evaluation. In the left breast, a possible asymmetry warrants further evaluation. IMPRESSION: Further evaluation is suggested for possible asymmetry in the right breast. Further evaluation is suggested for possible asymmetry in the left breast. RECOMMENDATION: Diagnostic mammogram and possibly ultrasound of both breasts. (Code:[id number]-59M) The patient will be contacted regarding the findings, and additional imaging will be scheduled. BI-RADS CATEGORY  0: Incomplete: Need additional imaging evaluation. Electronically Signed   By: Corean Salter M.D.   On: 08/05/2023 15:27

## 2023-10-06 NOTE — Assessment & Plan Note (Signed)
 Check hepatitis panel.  Check US  abdomen

## 2023-10-06 NOTE — Assessment & Plan Note (Signed)
Chronic thrombocytopenia For the work up of patient's thrombocytopenia, I recommend checking CBC;CMP, immature platelet fraction, LDH; smear review, folate, Vitamin B12, hepatitis, HIV, flowcytometry and monoclonal gammopathy workup.

## 2023-10-07 LAB — KAPPA/LAMBDA LIGHT CHAINS
Kappa free light chain: 35.2 mg/L — ABNORMAL HIGH (ref 3.3–19.4)
Kappa, lambda light chain ratio: 1.67 — ABNORMAL HIGH (ref 0.26–1.65)
Lambda free light chains: 21.1 mg/L (ref 5.7–26.3)

## 2023-10-08 ENCOUNTER — Telehealth: Payer: Self-pay

## 2023-10-08 DIAGNOSIS — D696 Thrombocytopenia, unspecified: Secondary | ICD-10-CM

## 2023-10-08 LAB — COMP PANEL: LEUKEMIA/LYMPHOMA

## 2023-10-08 LAB — MULTIPLE MYELOMA PANEL, SERUM
Albumin SerPl Elph-Mcnc: 3.7 g/dL (ref 2.9–4.4)
Albumin/Glob SerPl: 1.3 (ref 0.7–1.7)
Alpha 1: 0.2 g/dL (ref 0.0–0.4)
Alpha2 Glob SerPl Elph-Mcnc: 0.5 g/dL (ref 0.4–1.0)
B-Globulin SerPl Elph-Mcnc: 0.9 g/dL (ref 0.7–1.3)
Gamma Glob SerPl Elph-Mcnc: 1.4 g/dL (ref 0.4–1.8)
Globulin, Total: 3 g/dL (ref 2.2–3.9)
IgA: 157 mg/dL (ref 87–352)
IgG (Immunoglobin G), Serum: 1461 mg/dL (ref 586–1602)
IgM (Immunoglobulin M), Srm: 56 mg/dL (ref 26–217)
Total Protein ELP: 6.7 g/dL (ref 6.0–8.5)

## 2023-10-08 NOTE — Telephone Encounter (Signed)
 Spoke to pt's caregiver Kristina Foster and informed her of results and MD recommnedation for Ms. Kristina Foster.   Please schedule and contact Kristina Foster [phone]), with appt details:   6 months: lab/MD (cbc, imm plt fraction)

## 2023-10-08 NOTE — Telephone Encounter (Signed)
-----   Message from Zelphia Cap sent at 10/07/2023 10:52 PM EDT ----- Please let patient know that her blood work results are fine. Low platelet count is likely due to autoimmune process. I recommend observation. Please arrange her to follow up in 6 months lab MD please order cbc immature platelet fraction.  Thanks.  zy

## 2023-10-13 ENCOUNTER — Ambulatory Visit
Admission: RE | Admit: 2023-10-13 | Discharge: 2023-10-13 | Disposition: A | Discharge status: Home / Self Care | Source: Ambulatory Visit | Attending: Oncology | Admitting: Oncology

## 2023-10-13 DIAGNOSIS — R7989 Other specified abnormal findings of blood chemistry: Secondary | ICD-10-CM | POA: Insufficient documentation

## 2023-10-15 ENCOUNTER — Ambulatory Visit: Payer: Self-pay | Admitting: Oncology

## 2023-10-17 NOTE — Progress Notes (Signed)
 Called and spoke to Clara Nature conservation officer) and infomed her of results below. She requested documentation of infomation to place in patient's chart. Letter has been mailed along with appt reminder.

## 2024-04-05 ENCOUNTER — Other Ambulatory Visit

## 2024-04-05 ENCOUNTER — Ambulatory Visit: Admitting: Oncology
